# Patient Record
Sex: Male | Born: 1985 | Race: Black or African American | Hispanic: No | Marital: Single | Smoking: Current every day smoker
Health system: Southern US, Community
[De-identification: ages and names within clinical notes are randomized; demographics above are authoritative.]

---
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
## 2005-12-16 ENCOUNTER — Emergency Department (HOSPITAL_COMMUNITY): Admission: EM | Admit: 2005-12-16 | Discharge: 2005-12-16 | Payer: Self-pay | Admitting: Family Medicine

## 2006-01-28 ENCOUNTER — Emergency Department (HOSPITAL_COMMUNITY): Admission: EM | Admit: 2006-01-28 | Discharge: 2006-01-28 | Payer: Self-pay | Admitting: Family Medicine

## 2007-03-03 ENCOUNTER — Emergency Department (HOSPITAL_COMMUNITY): Admission: EM | Admit: 2007-03-03 | Discharge: 2007-03-03 | Payer: Self-pay | Admitting: Family Medicine

## 2008-05-29 ENCOUNTER — Emergency Department (HOSPITAL_COMMUNITY): Admission: EM | Admit: 2008-05-29 | Discharge: 2008-05-29 | Payer: Self-pay | Admitting: Emergency Medicine

## 2008-06-21 ENCOUNTER — Emergency Department (HOSPITAL_COMMUNITY): Admission: EM | Admit: 2008-06-21 | Discharge: 2008-06-21 | Payer: Self-pay | Admitting: Family Medicine

## 2009-01-08 ENCOUNTER — Emergency Department (HOSPITAL_COMMUNITY): Admission: EM | Admit: 2009-01-08 | Discharge: 2009-01-08 | Payer: Self-pay | Admitting: Emergency Medicine

## 2009-07-15 ENCOUNTER — Emergency Department (HOSPITAL_COMMUNITY): Admission: EM | Admit: 2009-07-15 | Discharge: 2009-07-15 | Payer: Self-pay | Admitting: Emergency Medicine

## 2009-10-08 ENCOUNTER — Emergency Department (HOSPITAL_COMMUNITY): Admission: EM | Admit: 2009-10-08 | Discharge: 2009-10-08 | Payer: Self-pay | Admitting: Family Medicine

## 2010-05-16 ENCOUNTER — Emergency Department (HOSPITAL_COMMUNITY): Admission: EM | Admit: 2010-05-16 | Discharge: 2010-05-16 | Payer: Self-pay | Admitting: Family Medicine

## 2010-06-05 ENCOUNTER — Emergency Department (HOSPITAL_COMMUNITY): Admission: EM | Admit: 2010-06-05 | Discharge: 2010-06-05 | Payer: Self-pay | Admitting: Emergency Medicine

## 2010-12-24 ENCOUNTER — Emergency Department (HOSPITAL_COMMUNITY)
Admission: EM | Admit: 2010-12-24 | Discharge: 2010-12-24 | Disposition: A | Discharge status: Home / Self Care | Payer: Self-pay | Attending: Emergency Medicine | Admitting: Emergency Medicine

## 2010-12-24 DIAGNOSIS — A64 Unspecified sexually transmitted disease: Secondary | ICD-10-CM | POA: Insufficient documentation

## 2010-12-24 DIAGNOSIS — R369 Urethral discharge, unspecified: Secondary | ICD-10-CM | POA: Insufficient documentation

## 2010-12-25 LAB — GC/CHLAMYDIA PROBE AMP, GENITAL
Chlamydia, DNA Probe: NEGATIVE
GC Probe Amp, Genital: NEGATIVE

## 2011-01-02 LAB — GC/CHLAMYDIA PROBE AMP, GENITAL
Chlamydia, DNA Probe: POSITIVE — AB
GC Probe Amp, Genital: NEGATIVE

## 2011-01-02 LAB — URINALYSIS, ROUTINE W REFLEX MICROSCOPIC
Bilirubin Urine: NEGATIVE
Ketones, ur: NEGATIVE mg/dL
Nitrite: NEGATIVE
Specific Gravity, Urine: 1.013 (ref 1.005–1.030)
Urobilinogen, UA: 1 mg/dL (ref 0.0–1.0)

## 2011-01-23 LAB — COMPREHENSIVE METABOLIC PANEL
ALT: 15 U/L (ref 0–53)
AST: 19 U/L (ref 0–37)
Albumin: 4 g/dL (ref 3.5–5.2)
Calcium: 9.1 mg/dL (ref 8.4–10.5)
GFR calc Af Amer: >60 mL/min (ref >60)
Sodium: 137 mEq/L (ref 135–145)
Total Protein: 7.2 g/dL (ref 6.0–8.3)

## 2011-01-23 LAB — DIFFERENTIAL
Eosinophils Absolute: 0.1 10*3/uL (ref 0.0–0.7)
Eosinophils Relative: 2 % (ref 0–5)
Lymphs Abs: 3.2 10*3/uL (ref 0.7–4.0)
Monocytes Absolute: 0.6 10*3/uL (ref 0.1–1.0)
Monocytes Relative: 7 % (ref 3–12)
Neutrophils Relative %: 58 % (ref 43–77)

## 2011-01-23 LAB — CBC
MCHC: 35.3 g/dL (ref 30.0–36.0)
RDW: 13.4 % (ref 11.5–15.5)

## 2011-06-03 ENCOUNTER — Emergency Department (HOSPITAL_COMMUNITY)
Admission: EM | Admit: 2011-06-03 | Discharge: 2011-06-03 | Disposition: A | Discharge status: Home / Self Care | Payer: Self-pay | Attending: Emergency Medicine | Admitting: Emergency Medicine

## 2011-06-03 ENCOUNTER — Emergency Department (HOSPITAL_COMMUNITY): Payer: Self-pay

## 2011-06-03 DIAGNOSIS — R55 Syncope and collapse: Secondary | ICD-10-CM | POA: Insufficient documentation

## 2011-06-03 DIAGNOSIS — R0602 Shortness of breath: Secondary | ICD-10-CM | POA: Insufficient documentation

## 2011-06-03 DIAGNOSIS — R079 Chest pain, unspecified: Secondary | ICD-10-CM | POA: Insufficient documentation

## 2011-06-03 DIAGNOSIS — R42 Dizziness and giddiness: Secondary | ICD-10-CM | POA: Insufficient documentation

## 2011-06-07 LAB — GC/CHLAMYDIA PROBE AMP, GENITAL: GC Probe Amp, Genital: NEGATIVE

## 2011-08-07 ENCOUNTER — Emergency Department (HOSPITAL_COMMUNITY)
Admission: EM | Admit: 2011-08-07 | Discharge: 2011-08-07 | Disposition: A | Discharge status: Home / Self Care | Payer: Self-pay | Attending: Emergency Medicine | Admitting: Emergency Medicine

## 2011-08-07 ENCOUNTER — Emergency Department (HOSPITAL_COMMUNITY): Payer: Self-pay

## 2011-08-07 DIAGNOSIS — S51809A Unspecified open wound of unspecified forearm, initial encounter: Secondary | ICD-10-CM | POA: Insufficient documentation

## 2011-08-07 DIAGNOSIS — J45909 Unspecified asthma, uncomplicated: Secondary | ICD-10-CM | POA: Insufficient documentation

## 2011-08-07 DIAGNOSIS — W268XXA Contact with other sharp object(s), not elsewhere classified, initial encounter: Secondary | ICD-10-CM | POA: Insufficient documentation

## 2011-08-07 DIAGNOSIS — S61209A Unspecified open wound of unspecified finger without damage to nail, initial encounter: Secondary | ICD-10-CM | POA: Insufficient documentation

## 2011-08-18 ENCOUNTER — Inpatient Hospital Stay (INDEPENDENT_AMBULATORY_CARE_PROVIDER_SITE_OTHER)
Admission: RE | Admit: 2011-08-18 | Discharge: 2011-08-18 | Disposition: A | Discharge status: Home / Self Care | Payer: Self-pay | Source: Ambulatory Visit | Attending: Family Medicine | Admitting: Family Medicine

## 2011-08-18 DIAGNOSIS — T148XXA Other injury of unspecified body region, initial encounter: Secondary | ICD-10-CM

## 2011-09-12 ENCOUNTER — Encounter: Payer: Self-pay | Admitting: Nurse Practitioner

## 2011-09-12 ENCOUNTER — Emergency Department (HOSPITAL_COMMUNITY)
Admission: EM | Admit: 2011-09-12 | Discharge: 2011-09-12 | Disposition: A | Discharge status: Home / Self Care | Payer: Self-pay | Attending: Emergency Medicine | Admitting: Emergency Medicine

## 2011-09-12 DIAGNOSIS — R109 Unspecified abdominal pain: Secondary | ICD-10-CM | POA: Insufficient documentation

## 2011-09-12 DIAGNOSIS — R300 Dysuria: Secondary | ICD-10-CM | POA: Insufficient documentation

## 2011-09-12 DIAGNOSIS — N509 Disorder of male genital organs, unspecified: Secondary | ICD-10-CM | POA: Insufficient documentation

## 2011-09-12 DIAGNOSIS — R369 Urethral discharge, unspecified: Secondary | ICD-10-CM | POA: Insufficient documentation

## 2011-09-12 LAB — URINALYSIS, ROUTINE W REFLEX MICROSCOPIC
Hgb urine dipstick: NEGATIVE
Specific Gravity, Urine: 1.026 (ref 1.005–1.030)
Urobilinogen, UA: 0.2 mg/dL (ref 0.0–1.0)
pH: 5.5 (ref 5.0–8.0)

## 2011-09-12 MED ORDER — AZITHROMYCIN 250 MG PO TABS
1000.0000 mg | ORAL_TABLET | Freq: Once | ORAL | Status: AC
  Administered 2011-09-12: 1000 mg via ORAL
  Filled 2011-09-12: qty 4

## 2011-09-12 MED ORDER — CEFTRIAXONE SODIUM 250 MG IJ SOLR
250.0000 mg | Freq: Once | INTRAMUSCULAR | Status: AC
  Administered 2011-09-12: 250 mg via INTRAMUSCULAR
  Filled 2011-09-12: qty 250

## 2011-09-12 MED ORDER — LIDOCAINE HCL (PF) 1 % IJ SOLN
INTRAMUSCULAR | Status: AC
  Administered 2011-09-12: 22:00:00
  Filled 2011-09-12: qty 5

## 2011-09-12 NOTE — ED Provider Notes (Signed)
Medical screening examination/treatment/procedure(s) were performed by non-physician practitioner and as supervising physician I was immediately available for consultation/collaboration.    Meeya Goldin R Lakendria Nicastro, MD 09/12/11 2347 

## 2011-09-12 NOTE — ED Provider Notes (Signed)
History     CSN: [id number] Arrival date & time: 09/12/2011  8:29 PM   First MD Initiated Contact with Patient 09/12/11 2131      Chief Complaint  Patient presents with  . Penile Discharge    (Consider location/radiation/quality/duration/timing/severity/associated sxs/prior treatment) HPI History provided by pt.   For the past week, pt has had clear urethral discharge and dysuria.  Has an occasional soreness in left testicle and lower abd.  No fever or rash.  Has had chlamydia in the past and presented similarly.  Girlfriend just admitted to being unfaithful and she has been diagnosed w/ chlamydia.   History reviewed. No pertinent past medical history.  History reviewed. No pertinent past surgical history.  History reviewed. No pertinent family history.  History  Substance Use Topics  . Smoking status: Current Everyday Smoker -- 1.0 packs/day  . Smokeless tobacco: Not on file  . Alcohol Use: 9.0 oz/week    15 Cans of beer per week      Review of Systems  All other systems reviewed and are negative.    Allergies  Penicillins  Home Medications  No current outpatient prescriptions on file.  BP 109/57  Pulse 96  Temp(Src) 98.1 F (36.7 C) (Oral)  Resp 18  Ht 5\' 9"  (1.753 m)  Wt 165 lb (74.844 kg)  BMI 24.37 kg/m2  SpO2 97%  Physical Exam  Nursing note and vitals reviewed. Constitutional: He is oriented to person, place, and time. He appears well-developed and well-nourished. No distress.  HENT:  Head: Normocephalic and atraumatic.  Eyes:       Normal appearance  Neck: Normal range of motion.  Genitourinary: Penis normal. Right testis shows no mass, no swelling and no tenderness. Right testis is descended. Left testis shows no mass, no swelling and no tenderness. Left testis is descended. Circumcised. No penile tenderness. No discharge found.       No rash.  Small amt clear urethral discharge.  Testicles non-tender.   Lymphadenopathy:       Right: No  inguinal adenopathy present.       Left: No inguinal adenopathy present.  Neurological: He is alert and oriented to person, place, and time.  Skin: Skin is warm and dry.  Psychiatric: He has a normal mood and affect. His behavior is normal.    ED Course  Procedures (including critical care time)  Labs Reviewed  URINALYSIS, ROUTINE W REFLEX MICROSCOPIC - Abnormal; Notable for the following:    Ketones, ur 15 (*)    All other components within normal limits  URINE CULTURE  GC/CHLAMYDIA PROBE AMP, GENITAL   No results found.   1. Urethral discharge in male       MDM  Pt presents w/ urethral discharge.  Recent exposure to chlamydia.  No fever, rash, testicular tenderness on exam.  GC/Chlam culture pending.  Pt received zithromax and rocephin.  Referred to STD clinic.  Return precautions discussed.         Arie Sabina Raymond, [place] 09/12/11 2201

## 2011-09-12 NOTE — ED Notes (Signed)
Reports mild abd pain and penis discharge x 4 days, found out girlfriend has chlamydia today

## 2011-09-12 NOTE — ED Notes (Signed)
ASSUMED CARE ON PT. DENIES PAIN , RESPIRATIONS UNLABORED, CALL LIGHT WITHIN REACH . WAITING FOR EDP/PA EVALUATION .

## 2011-09-14 LAB — URINE CULTURE: Culture: NO GROWTH

## 2012-02-10 ENCOUNTER — Emergency Department (HOSPITAL_COMMUNITY)
Admission: EM | Admit: 2012-02-10 | Discharge: 2012-02-10 | Disposition: A | Discharge status: Home / Self Care | Payer: Self-pay | Attending: Emergency Medicine | Admitting: Emergency Medicine

## 2012-02-10 ENCOUNTER — Encounter (HOSPITAL_COMMUNITY): Payer: Self-pay | Admitting: *Deleted

## 2012-02-10 DIAGNOSIS — N509 Disorder of male genital organs, unspecified: Secondary | ICD-10-CM | POA: Insufficient documentation

## 2012-02-10 DIAGNOSIS — Z202 Contact with and (suspected) exposure to infections with a predominantly sexual mode of transmission: Secondary | ICD-10-CM | POA: Insufficient documentation

## 2012-02-10 DIAGNOSIS — R369 Urethral discharge, unspecified: Secondary | ICD-10-CM | POA: Insufficient documentation

## 2012-02-10 MED ORDER — CEFTRIAXONE SODIUM 250 MG IJ SOLR
250.0000 mg | Freq: Once | INTRAMUSCULAR | Status: AC
  Administered 2012-02-10: 250 mg via INTRAMUSCULAR
  Filled 2012-02-10: qty 250

## 2012-02-10 MED ORDER — LIDOCAINE HCL (PF) 1 % IJ SOLN
INTRAMUSCULAR | Status: AC
  Administered 2012-02-10: 1.2 mL
  Filled 2012-02-10: qty 5

## 2012-02-10 MED ORDER — AZITHROMYCIN 250 MG PO TABS
1000.0000 mg | ORAL_TABLET | Freq: Once | ORAL | Status: AC
  Administered 2012-02-10: 1000 mg via ORAL
  Filled 2012-02-10: qty 4

## 2012-02-10 NOTE — ED Provider Notes (Signed)
History     CSN: [id number]  Arrival date & time 02/10/12  1443   First MD Initiated Contact with Patient 02/10/12 1523      Chief Complaint  Patient presents with  . Abdominal Pain    (Consider location/radiation/quality/duration/timing/severity/associated sxs/prior treatment) HPI  Patient presents to ER complaining of a three day hx of gradual onset penile d/c, mild testicular "soreness" stating that his girlfriend just recently told him she was positive for chlamydia. He denies aggravating or alleviating factors. Denies fevers, chills, abdominal pain, n/v/d, testicular swelling, genital lesions. States he has no known medical problems and takes no meds on a regular basis. Symptoms were gradual onsets, intermittent and persistent. States remote hx of GC/chlamydia infection.   History reviewed. No pertinent past medical history.  History reviewed. No pertinent past surgical history.  No family history on file.  History  Substance Use Topics  . Smoking status: Current Everyday Smoker -- 1.0 packs/day  . Smokeless tobacco: Not on file  . Alcohol Use: 9.0 oz/week    15 Cans of beer per week      Review of Systems  Constitutional: Negative for fever and chills.  Eyes: Negative for discharge and itching.  Respiratory: Negative for shortness of breath.   Cardiovascular: Negative for chest pain.  Gastrointestinal: Negative for nausea, vomiting, abdominal pain, diarrhea, blood in stool, abdominal distention and rectal pain.  Genitourinary: Positive for discharge and testicular pain. Negative for dysuria, flank pain, penile swelling, genital sores and penile pain.  Musculoskeletal: Negative for myalgias and joint swelling.  Skin: Negative for rash.  Neurological: Negative for dizziness.  Hematological: Negative for adenopathy.    Allergies  Penicillins  Home Medications  No current outpatient prescriptions on file.  BP 123/80  Pulse 102  Temp(Src) 98.4 F (36.9 C)  (Oral)  Resp 20  SpO2 97%  Physical Exam  Nursing note and vitals reviewed. Constitutional: He is oriented to person, place, and time. He appears well-developed and well-nourished.  HENT:  Head: Normocephalic and atraumatic.  Neck: Normal range of motion. Neck supple.  Cardiovascular: Normal rate.   Pulmonary/Chest: Effort normal.  Abdominal: Soft. Bowel sounds are normal. He exhibits no distension and no mass. There is no tenderness. There is no rebound and no guarding.  Genitourinary: Penis normal. No penile tenderness.       No penile discharge. No testicular tenderness to palpation. Testes descended bilaterally.  Musculoskeletal: Normal range of motion.  Lymphadenopathy:    He has no cervical adenopathy.       Right: No inguinal adenopathy present.       Left: No inguinal adenopathy present.  Neurological: He is alert and oriented to person, place, and time.  Skin: Skin is warm and dry. No rash noted.  Psychiatric: He has a normal mood and affect.    ED Course  Procedures (including critical care time)  PO zithromax and IM rocephin.   Labs Reviewed - No data to display No results found.   1. Exposure to STD       MDM  Young healthy male with subjective hx of penile discharge and report of chlamydia exposure but no testicular tenderness to palpation, abdominal pain or penile lesions. Discussed at length with patient the need for followup with [facility] STD clinic for HIV screening. Patient prophylactically treated for gonorrhea and chlamydia.         Jenness Corner, [place] 02/10/12 1541

## 2012-02-10 NOTE — ED Notes (Signed)
Patient reports his girlfriend just informed him that she has clamydia.  Patient has had penile discharge and he has noticed his testicles are sore at times.  He has noticed sx for 3 days

## 2012-02-10 NOTE — ED Provider Notes (Signed)
Medical screening examination/treatment/procedure(s) were performed by non-physician practitioner and as supervising physician I was immediately available for consultation/collaboration.  Cheri Guppy, MD 02/10/12 [phone]

## 2012-02-10 NOTE — ED Notes (Signed)
Pt reports penile d/c, lower abd pain, and bilateral testicle pain x3 days. Pt reports his girlfriend informed him she tested positive for gonorrhea.

## 2012-02-11 LAB — GC/CHLAMYDIA PROBE AMP, GENITAL
Chlamydia, DNA Probe: NEGATIVE
GC Probe Amp, Genital: NEGATIVE

## 2013-01-18 ENCOUNTER — Encounter (HOSPITAL_COMMUNITY): Payer: Self-pay | Admitting: *Deleted

## 2013-01-18 ENCOUNTER — Emergency Department (HOSPITAL_COMMUNITY)
Admission: EM | Admit: 2013-01-18 | Discharge: 2013-01-18 | Disposition: A | Discharge status: Home / Self Care | Payer: Self-pay | Attending: Emergency Medicine | Admitting: Emergency Medicine

## 2013-01-18 DIAGNOSIS — R369 Urethral discharge, unspecified: Secondary | ICD-10-CM | POA: Insufficient documentation

## 2013-01-18 DIAGNOSIS — K029 Dental caries, unspecified: Secondary | ICD-10-CM | POA: Insufficient documentation

## 2013-01-18 DIAGNOSIS — Z202 Contact with and (suspected) exposure to infections with a predominantly sexual mode of transmission: Secondary | ICD-10-CM | POA: Insufficient documentation

## 2013-01-18 DIAGNOSIS — R599 Enlarged lymph nodes, unspecified: Secondary | ICD-10-CM | POA: Insufficient documentation

## 2013-01-18 DIAGNOSIS — Z7251 High risk heterosexual behavior: Secondary | ICD-10-CM | POA: Insufficient documentation

## 2013-01-18 DIAGNOSIS — F17200 Nicotine dependence, unspecified, uncomplicated: Secondary | ICD-10-CM | POA: Insufficient documentation

## 2013-01-18 MED ORDER — CEFTRIAXONE SODIUM 250 MG IJ SOLR
250.0000 mg | Freq: Once | INTRAMUSCULAR | Status: AC
  Administered 2013-01-18: 250 mg via INTRAMUSCULAR
  Filled 2013-01-18: qty 250

## 2013-01-18 MED ORDER — AZITHROMYCIN 250 MG PO TABS
1000.0000 mg | ORAL_TABLET | Freq: Once | ORAL | Status: AC
  Administered 2013-01-18: 1000 mg via ORAL
  Filled 2013-01-18: qty 4

## 2013-01-18 NOTE — ED Provider Notes (Signed)
History     CSN: [id number]  Arrival date & time 01/18/13  1246   First MD Initiated Contact with Patient 01/18/13 1322      Chief Complaint  Patient presents with  . Exposure to STD    (Consider location/radiation/quality/duration/timing/severity/associated sxs/prior treatment) Patient is a 27 y.o. male presenting with STD exposure.  Exposure to STD   Pt has several complaints, chief among them is exposure to STD. He reports having unprotected intercourse about 2 weeks ago. States he has noticed penile discharge (clear, only when urinating) associated with mild testicular soreness and lower abdominal soreness. He was seen for similar symptoms about a year ago, had neg GC/C then.   He has a secondary complaint of 'knots' around his R ear. He has some tenderness, concerned about lymph nodes  Also reports intermittent pain in R upper and lower molars. Asking for referral to dentist.   History reviewed. No pertinent past medical history.  History reviewed. No pertinent past surgical history.  History reviewed. No pertinent family history.  History  Substance Use Topics  . Smoking status: Current Every Day Smoker -- 1.00 packs/day  . Smokeless tobacco: Not on file  . Alcohol Use: 9.0 oz/week    15 Cans of beer per week      Review of Systems All other systems reviewed and are negative except as noted in HPI.    Allergies  Penicillins  Home Medications  No current outpatient prescriptions on file.  BP 125/70  Pulse 111  Temp(Src) 98.4 F (36.9 C) (Oral)  Resp 16  SpO2 96%  Physical Exam  Nursing note and vitals reviewed. Constitutional: He is oriented to person, place, and time. He appears well-developed and well-nourished.  HENT:  Head: Normocephalic and atraumatic.  Poor dentition; several small non-tender lymph nodes in periauricular area  Eyes: EOM are normal. Pupils are equal, round, and reactive to light.  Neck: Normal range of motion. Neck supple.   Cardiovascular: Normal rate, normal heart sounds and intact distal pulses.   Pulmonary/Chest: Effort normal and breath sounds normal.  Abdominal: Bowel sounds are normal. He exhibits no distension. There is no tenderness. There is no rebound and no guarding.  Genitourinary:  No discharge, no testicular tenderness or swelling  Musculoskeletal: Normal range of motion. He exhibits no edema and no tenderness.  Lymphadenopathy:    He has cervical adenopathy.  Neurological: He is alert and oriented to person, place, and time. He has normal strength. No cranial nerve deficit or sensory deficit.  Skin: Skin is warm and dry. No rash noted.  Psychiatric: He has a normal mood and affect.    ED Course  Procedures (including critical care time)  Labs Reviewed  GC/CHLAMYDIA PROBE AMP   No results found.   1. Exposure to STD   2. Dental caries       MDM  Empiric treatment for GC/C, swab sent. Advised [facility] follow up. Given dentist referral.        Bonnita Levan. Bernette Mayers, MD 01/18/13 1342

## 2013-01-18 NOTE — ED Notes (Signed)
Pt reports having penile discharge, abd pain, scrotum pain, pt thinks he has std. Also wants small knots to be checked around his right ear. No distress noted at triage.

## 2013-01-18 NOTE — ED Notes (Signed)
Pt c/o penile d/c along with abd pain and painful urination that started a week ago. Pt reports he did have unprotected sex. Pt also c/o right sided tooth pain, sts he has a wisdom tooth that is broken off and causing him a great deal of pain. Pt has swelling to right side of neck and small bump behind right side of ear. Pt in nad, speaking in full sentences, skin warm and dry, resp e/u.

## 2013-01-19 ENCOUNTER — Encounter (HOSPITAL_COMMUNITY): Payer: Self-pay | Admitting: *Deleted

## 2013-01-19 ENCOUNTER — Emergency Department (INDEPENDENT_AMBULATORY_CARE_PROVIDER_SITE_OTHER)
Admission: EM | Admit: 2013-01-19 | Discharge: 2013-01-19 | Disposition: A | Discharge status: Home / Self Care | Payer: Self-pay | Source: Home / Self Care | Attending: Family Medicine | Admitting: Family Medicine

## 2013-01-19 DIAGNOSIS — B0239 Other herpes zoster eye disease: Secondary | ICD-10-CM

## 2013-01-19 LAB — GC/CHLAMYDIA PROBE AMP: GC Probe RNA: NEGATIVE

## 2013-01-19 MED ORDER — ACYCLOVIR 200 MG PO CAPS: 800.0000 mg | ORAL_CAPSULE | Freq: Every day | ORAL | Status: DC

## 2013-01-19 MED ORDER — HYDROCODONE-ACETAMINOPHEN 5-325 MG PO TABS: 2.0000 | ORAL_TABLET | Freq: Four times a day (QID) | ORAL | Status: DC | PRN

## 2013-01-19 MED ORDER — PREDNISONE 20 MG PO TABS: ORAL_TABLET | ORAL | Status: DC

## 2013-01-19 MED ORDER — SULFAMETHOXAZOLE-TRIMETHOPRIM 800-160 MG PO TABS: 1.0000 | ORAL_TABLET | Freq: Two times a day (BID) | ORAL | Status: AC

## 2013-01-19 NOTE — ED Provider Notes (Signed)
History     CSN: [id number]  Arrival date & time 01/19/13  1036   First MD Initiated Contact with Patient 01/19/13 1043      Chief Complaint  Patient presents with  . Rash    (Consider location/radiation/quality/duration/timing/severity/associated sxs/prior treatment) HPI Comments: 27 year old male with recent ED visits for STD exposure yesterday. Comes today complaining of a rash involving the upper and lower lid of his right eye. He has had tender lymph nodes anterior to his right ear and in the right side of his neck that was noted on ED doctor notes yesterday. He also has had burning sensation in the right upper side of his face and scalp but the rash is more noticeable since yesterday evening and today,  now associated with mild swelling. Denies any pain inside his eye. No pain with ocular movements. No photophobia. No eye tearing. No eye drainage. Patient had chickenpox as a child. No history of shingles. Patient was treated empirically for gonorrhea and chlamydia yesterday with Rocephin and azithromycin.    History reviewed. No pertinent past medical history.  History reviewed. No pertinent past surgical history.  History reviewed. No pertinent family history.  History  Substance Use Topics  . Smoking status: Current Every Day Smoker -- 1.00 packs/day  . Smokeless tobacco: Not on file  . Alcohol Use: 9.0 oz/week    15 Cans of beer per week      Review of Systems  Constitutional: Negative for fever, chills and fatigue.  HENT: Negative for congestion and sore throat.   Eyes: Negative for photophobia, pain, discharge, redness, itching and visual disturbance.       Left upper and lower eye lid rash as per HPI  Skin: Positive for rash.  Neurological: Negative for dizziness, seizures, facial asymmetry, numbness and headaches.  Hematological: Positive for adenopathy.    Allergies  Penicillins  Home Medications   Current Outpatient Rx  Name  Route  Sig  Dispense   Refill  . acetaminophen (TYLENOL) 500 MG tablet   Oral   Take 1,000 mg by mouth every 6 (six) hours as needed for pain.         Marland Kitchen acyclovir (ZOVIRAX) 200 MG capsule   Oral   Take 4 capsules (800 mg total) by mouth 5 (five) times daily.   200 capsule   0   . HYDROcodone-acetaminophen (NORCO/VICODIN) 5-325 MG per tablet   Oral   Take 2 tablets by mouth every 6 (six) hours as needed for pain.   20 tablet   0   . predniSONE (DELTASONE) 20 MG tablet      2 tabs po daily for 7 days   14 tablet   0   . sulfamethoxazole-trimethoprim (BACTRIM DS,SEPTRA DS) 800-160 MG per tablet   Oral   Take 1 tablet by mouth 2 (two) times daily.   14 tablet   0     BP 138/75  Pulse 86  Temp(Src) 97.9 F (36.6 C) (Oral)  Resp 16  SpO2 100%  Physical Exam  Nursing note and vitals reviewed. Constitutional: He is oriented to person, place, and time. He appears well-developed and well-nourished. No distress.  HENT:  Head: Normocephalic and atraumatic.  Mouth/Throat: Oropharynx is clear and moist. No oropharyngeal exudate.  Eyes: Conjunctivae and EOM are normal. Pupils are equal, round, and reactive to light. Right eye exhibits no discharge. Left eye exhibits no discharge.  No corneal ulcers or abrasions on fluorescein stain. There is a mild vesicular confluent rash  involving left upper eye lid and area below left lower eye lid closer to the nose bridge. Minimal base erythema. No induration or fluctuations. No drainage. Few vesicles with dry crusting on top.  There are associated reactive tender preauricular lymphadenopathies.  Patient exhibits hyperesthesia in right upper face quadrant and temporo parietal scalp area despite no evidence of rash in the last mentioned area.   Cardiovascular: Normal rate, regular rhythm and normal heart sounds.   Pulmonary/Chest: Effort normal and breath sounds normal.  Lymphadenopathy:    He has no cervical adenopathy.  Neurological: He is alert and oriented  to person, place, and time.  Skin: Rash noted. He is not diaphoretic.  As in eye exam    ED Course  Procedures (including critical care time)  Labs Reviewed - No data to display No results found.   1. Zoster blepharitis       MDM  Impress zoster. Prescribed acyclovir, prednisone and Norco. Also prescribed Septra for possible over infection. Asked to followup with the eye specialist and referral was provided. Supportive care and red flags that should prompt his return to medical attention discussed with patient and provided in writing.        Sharin Grave, MD 01/20/13 [id number]

## 2013-01-19 NOTE — ED Notes (Signed)
Pt  Has  Several   Raised  Red   Bumps   On  Face  Redness  Swelling is  Present  And  The  Pay  Has  Swelling  In gland  Areas  Around  Ear  -  The  Eye  Appears  Slight  Irritated  As  Well  On  The  r   Side  Of  Face       Symptoms  X  About  6  Days

## 2013-04-26 IMAGING — CR DG CHEST 2V
2 series · 2 of 2 positions shown · non-contrast
Comparison: Chest radiograph 07/15/2009

CLINICAL DATA: Shortness of breath.  Mid chest pain and dizziness.

CHEST - 2 VIEW

[w chest pa]
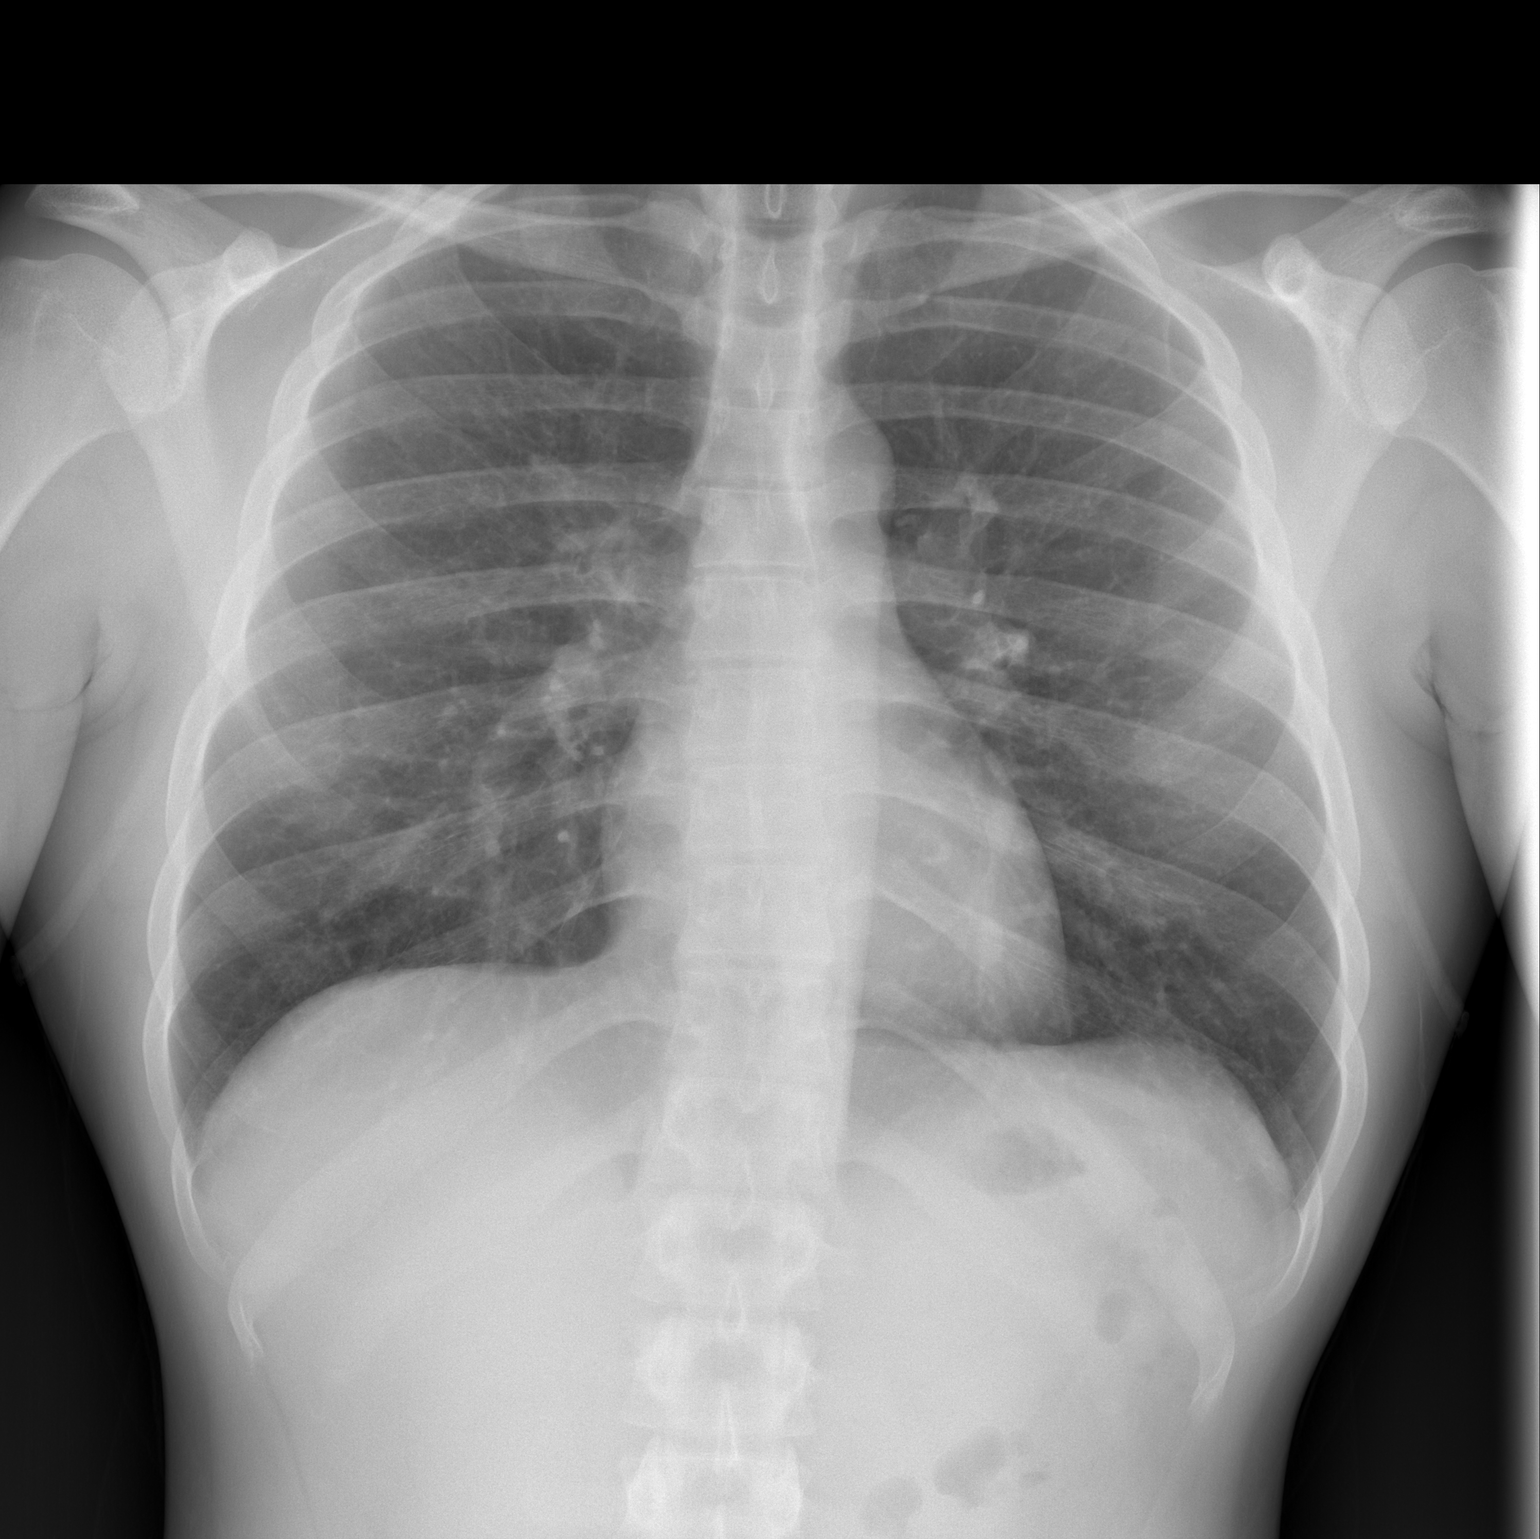

[w chest lat]
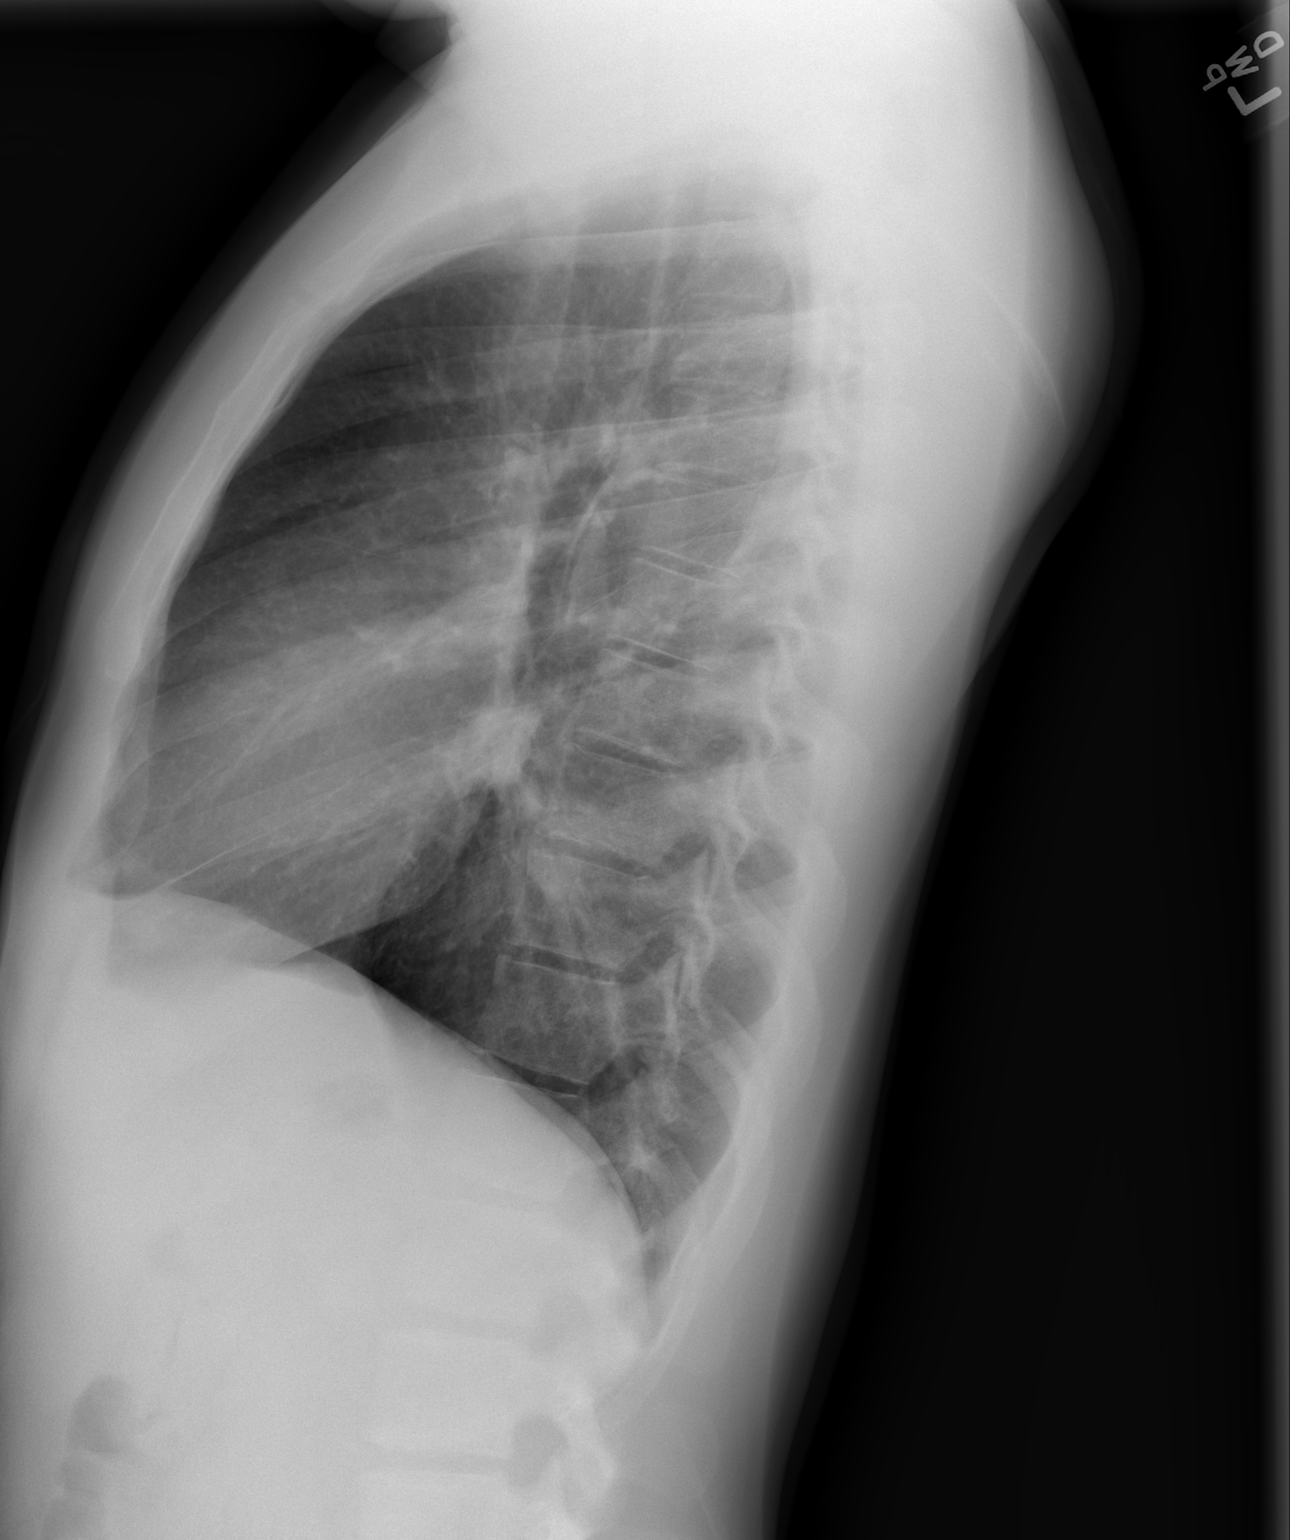

[2 of 2 positions shown; findings below may reference images not displayed]

FINDINGS: The heart, mediastinal, and hilar contours are normal.
The trachea is midline.  The lungs are normally expanded and clear.
There is no pleural effusion or pneumothorax.  Visualized bony
thorax is intact.
IMPRESSION: No acute cardiopulmonary disease.

## 2013-05-18 ENCOUNTER — Emergency Department (HOSPITAL_COMMUNITY)
Admission: EM | Admit: 2013-05-18 | Discharge: 2013-05-18 | Disposition: A | Discharge status: Home / Self Care | Payer: Self-pay | Attending: Emergency Medicine | Admitting: Emergency Medicine

## 2013-05-18 ENCOUNTER — Encounter (HOSPITAL_COMMUNITY): Payer: Self-pay | Admitting: Cardiology

## 2013-05-18 DIAGNOSIS — Z880 Allergy status to penicillin: Secondary | ICD-10-CM | POA: Insufficient documentation

## 2013-05-18 DIAGNOSIS — Y929 Unspecified place or not applicable: Secondary | ICD-10-CM | POA: Insufficient documentation

## 2013-05-18 DIAGNOSIS — S61209A Unspecified open wound of unspecified finger without damage to nail, initial encounter: Secondary | ICD-10-CM | POA: Insufficient documentation

## 2013-05-18 DIAGNOSIS — W260XXA Contact with knife, initial encounter: Secondary | ICD-10-CM | POA: Insufficient documentation

## 2013-05-18 DIAGNOSIS — S61217A Laceration without foreign body of left little finger without damage to nail, initial encounter: Secondary | ICD-10-CM

## 2013-05-18 DIAGNOSIS — Y9389 Activity, other specified: Secondary | ICD-10-CM | POA: Insufficient documentation

## 2013-05-18 DIAGNOSIS — W261XXA Contact with sword or dagger, initial encounter: Secondary | ICD-10-CM | POA: Insufficient documentation

## 2013-05-18 DIAGNOSIS — F17200 Nicotine dependence, unspecified, uncomplicated: Secondary | ICD-10-CM | POA: Insufficient documentation

## 2013-05-18 NOTE — ED Notes (Signed)
Bleeding is controlled

## 2013-05-18 NOTE — ED Provider Notes (Signed)
Medical screening examination/treatment/procedure(s) were conducted as a shared visit with non-physician practitioner(s) and myself.  I personally evaluated the patient during the encounter  Pt with cut to proximal left non dominant hand, 5th phalanx while cutting steak, appears to have injured his PDF.  Will consult with hand surgery.    Gavin Pound. Oletta Lamas, MD 05/18/13 7829

## 2013-05-18 NOTE — ED Notes (Signed)
Pt not located in room.

## 2013-05-18 NOTE — ED Notes (Signed)
Pt returned to room  

## 2013-05-18 NOTE — Discharge Instructions (Signed)
Splint Care Splints protect and rest injuries. Splints can be made of plaster, fiberglass, or metal. They are used to treat broken bones, sprains, tendonitis, and other injuries. HOME CARE  Keep the injured area raised (elevated) while sitting or lying down. Keep the injured body part just above the level of the heart. This will decrease puffiness (swelling) and pain.  If an elastic bandage was used to hold the splint, it can be loosened. Only loosen it to make room for puffiness and to ease pain.  Keep the splint clean and dry.  Do not scratch the skin under the splint with sharp or pointed objects.  Follow up with your doctor as told. GET HELP RIGHT AWAY IF:   There is more pain or pressure around the injury.  There is numbness, tingling, or pain in the toes or fingers past the injury.  The fingers or toes become cold or blue.  The splint becomes too soft or breaks before the injury is healed. MAKE SURE YOU:   Understand these instructions.  Will watch this condition.  Will get help right away if you are not doing well or get worse. Document Released: 07/14/2008 Document Revised: 12/28/2011 Document Reviewed: 07/14/2008 [facility] Patient Information 2014 [place], [place].  RESOURCE GUIDE  Chronic Pain Problems: Contact [facility]  [phone] Patients need to be referred by their primary care doctor.  Insufficient Money for Medicine: Contact United Way:  call [phone]  No Primary Care Doctor: - Call Health Connect  [phone] - can help you locate a primary care doctor that  accepts your insurance, provides certain services, etc. - Physician Referral Service- [phone]  Agencies that provide inexpensive medical care: - [facility]  [phone] - [facility]  [phone] - [facility]  [phone] - Women's Clinic  [phone] - Planned Parenthood  [phone] [facility]   [phone]  Medicaid-accepting [facility] Providers: - [facility]- [address]  [phone], Mon-Fri 9am-7pm, Sat 9am-1pm - [facility]- [address] [place], Suite [place]  [phone] - [facility]- [address], Suite [place]  [phone] [facility]- [address]  [phone] - Renaye Rakers- [address] [place], [address], [phone]  Only accepts [place] patients after they have their name  applied to their card  Self Pay (no insurance) in [place]: - Sickle Cell Patients - [facility]  [address] [place], [phone] - [facility]- [address] [place]  [phone]       [facility] [place]- [address]       -     [facility]- see information above (Speak to [employer] if you do not have insurance)       -  [facility]- [address],  [phone]       -  [facility]- [address], [phone]       -  Dr Julio Sicks-  [address], [place], [phone]       -  [facility] - [address] [phone]       -  [facility]- [address], [phone], also [address] [phone]       -  [facility]- [address] [place], [phone], 1st & 3rd Saturday         every month, 10am-1pm  -     [facility] [employer]   [address], [place].   Phone:  [phone], Fax:  [phone]. Hours of Operation:  9 am - 6 pm, M-F.  -     [facility] for Children   [address], [place]   Phone: [phone], Fax: [phone]. Hours of Operation:  8:30 am - 5:30 pm, M-F.  [facility] [address] [place], [place] [address] [phone]  [facility] [address] [place], [place] [address] [phone]  1) Find a Doctor and Pay Out of  Pocket Although you won't have to find out who is covered by your insurance plan, it is a good idea to ask around and get recommendations. You will then need to call the office and see if the doctor you have chosen will accept you as a new patient and what types of options they offer for patients who are self-pay. Some doctors offer discounts or will set up payment plans for their patients who do not have insurance, but you will need to ask so you aren't surprised when you get to your appointment.  2) Contact Your Local Health Department Not all health departments have doctors that can see patients for sick visits, but many do, so it is worth a call to see if yours does. If you don't know where your local health department is, you can check in your phone book. The CDC also has a tool to help you locate your state's health department, and many state websites also have listings of all of their local health departments.  3) Find a Walk-in Clinic If your illness is not likely to be very severe or complicated, you may want to try a walk in clinic. These are popping up all over the country in pharmacies, drugstores, and shopping centers. They're usually staffed by nurse practitioners or physician assistants that have been trained to treat common illnesses and complaints. They're usually fairly quick and inexpensive. However, if you have serious medical issues or chronic medical problems, these are probably not your best option  STD Testing - [facility] Department of [facility] [place], STD Clinic, [address] [place], phone [phone] or [phone].  Monday - Friday, call for an appointment. [facility] Department of [employer], STD Clinic, [place] [address], [place], phone [phone] or [phone].  Monday - Friday, call for an appointment.  Abuse/Neglect: [facility] Child Abuse Hotline 308-808-1175 [facility] Child Abuse Hotline 661-577-0853  (After Hours)  Emergency Shelter:  [facility] 5203352524  Maternity Homes: - Room at the [place] [facility] [phone] - [facility] 5033503422  MRSA Hotline #:   [phone]  Dental Assistance If unable to pay or uninsured, contact:  [facility]. to become qualified for the adult dental clinic.  Patients with Medicaid: [facility] [phone] [address], [phone] [address], [phone]  If unable to pay, or uninsured, contact [facility] [phone] in [place], [phone] in [facility]) to become qualified for the adult dental clinic  [facility] [address] [place], [place] [address] [phone] [email or web address]  Other Proofreader Services: Chartered loss adjuster [facility]- [address], [place]  [place], [place], [address], [phone], Ext. [phone], 2nd and 4th Thursday of the month at 6:30am.  10 clients each day by appointment, can sometimes see walk-in patients if someone does not show for an appointment. [facility]- [address] [place], [place], [address], [phone] - [facility] [address] [place], [place], [address], [phone] - [facility]- [phone] [facility]- [phone] [facility] Health [phone]       Behavioral Health Resources in the [facility]  Intensive Outpatient Programs: [facility]      [address] [place], [place] [phone] Both a day and evening program       [facility] Outpatient     [address]        [place], [place] [address] [phone]         ADS: [facility] [address] [place] [phone]  [facility] Mental Health ACCESS LINE: [phone] or [phone] [address] [place], [place]  [address] [email or web address]   Substance Abuse Resources: - [facility] Drug Services  [phone] - [facility] [phone] - [facility] [place] [phone] Floydene Flock [phone] - Residential & Outpatient Substance Abuse Program  [phone]  Psychological Services: [facility]  [phone] [facility]  [phone] - [facility], [phone] [place]. [address], [place], ACCESS LINE: [phone] or [phone], [email or web address]  Mobile Crisis Teams:                                        Therapeutic Alternatives         Mobile Crisis Care Unit [phone]             [facility] [address] Ginette Otto [phone]                                         Interventionist [address] [place] [phone]  Self-Help/Support Groups: Mental Health Assoc. of [employer] of support groups [phone] (call for more info)  Narcotics Anonymous (NA) Caring Services [address] [place] - 2 meetings at this location  Residential Treatment Programs:  [facility]      [address]        [place]       [phone]         [facility] [address] [place] [address] [place], [place]  [address] [phone]  [facility]  [address] [place], [place] [address] [phone] Admissions: 8am-3pm M-F  [facility]     [address]        [place], [place] [address]       [phone]         [facility] [address] [place], [place] [phone]  [facility] [address] [place], [place] [phone]  [facility] - Intensive Outpatient      [address]     [place], [place]       [phone]         [facility] ([facility] Recovery [facility]     [address] [place],  [place] [phone] or [phone]  Residential Treatment Services (RTS), Medicaid [address] [place], [place] [phone]  [facility]ellowship [address]                                               [address] [place] [phone]  [facility] Resources: [phone]               General Therapy                                                Angie Fava, PhD        [address] [place], [place] [address]         [phone]   Insurance  [facility] Behavioral   [address] [place], [place] [address] [phone]  [facility] [address] [place], [place] [address] [phone] Insurance/Medicaid/sponsorship through [facility]                                              [address]                                        [place], [place] [address]    Therapy/tele-psych/case         [phone]          [facility] [address], [place]  [address]  Adolescent/group home/case management [phone]                                           Creola Corn PhD       General therapy       Insurance   [phone]         Dr. Lolly Mustache, [place], M-F [phone]  [facility] [place]  [employer] [facility] Dept. [address]                 [address]         [address] [place] [address]  [place]                                               Cristobal Goldmann Phone:  [phone]  Phone:  [phone]                   Phone:  [phone]  [facility], [phone] - [facility] - [phone]       -     [facility] in [place], [address],             [phone], Insurance  [place] Child Abuse Hotline 573-625-7821 or [phone] (After Hours)

## 2013-05-18 NOTE — Progress Notes (Signed)
Orthopedic Tech Progress Note Patient Details:  Dwayne Miller Jan 13, 1986 [id number]  Ortho Devices Type of Ortho Device: Finger splint Ortho Device/Splint Location: RUE Ortho Device/Splint Interventions: Ordered;Application   Jennye Moccasin 05/18/2013, 7:37 PM

## 2013-05-18 NOTE — ED Notes (Signed)
Pt at nurses station asking how much more time he is going to have to wait to get his hand stitched up.  PA informed pt that she is waiting for a phone call from hand surgeon in order to suture his hand.  Pt confrontational with PA and RN stating "I have to take the bus and the last bus leaves at 10.  I can't be here past then."  PA informed pt she would get him out by then.

## 2013-05-18 NOTE — ED Notes (Signed)
Pt reports that he was cutting steak and cut his right pinky finger. Bleeding controlled.

## 2013-05-18 NOTE — ED Provider Notes (Signed)
CSN: [id number]     Arrival date & time 05/18/13  1648 History  This chart was scribed for non-physician practitioner Dierdre Forth, PA-C, working with Gavin Pound. Oletta Lamas, MD, by Yevette Edwards, ED Scribe. This patient was seen in room TR06C/TR06C and the patient's care was started at 5:38 PM.   First MD Initiated Contact with Patient 05/18/13 1714     Chief Complaint  Patient presents with  . Extremity Laceration    Patient is a 27 y.o. male presenting with skin laceration. The history is provided by the patient. No language interpreter was used.  Laceration Location:  Finger Finger laceration location:  L little finger Laceration mechanism:  Knife Foreign body present:  No foreign bodies Relieved by:  Pressure Worsened by:  Movement  HPI Comments: Dwayne Miller is a 27 y.o. male who presents to the Emergency Department complaining of a laceration to his left pinky finger which occurred as he was attempting to cut a steak with a steak-knife. He states that bending his finger causes pain, and he has difficulty moving his finger certain ways. At bedside, the bleeding is controlled with applied pressure. The pt reports that he has a h/o sutures to other areas. The pt is right-handed.   History reviewed. No pertinent past medical history. History reviewed. No pertinent past surgical history. History reviewed. No pertinent family history. History  Substance Use Topics  . Smoking status: Current Every Day Smoker -- 1.00 packs/day    Types: Cigarettes  . Smokeless tobacco: Not on file  . Alcohol Use: 9.0 oz/week    15 Cans of beer per week    Review of Systems  Musculoskeletal: Positive for arthralgias.  Skin: Positive for wound.  All other systems reviewed and are negative.    Allergies  Penicillins  Home Medications   Current Outpatient Rx  Name  Route  Sig  Dispense  Refill  . acetaminophen (TYLENOL) 500 MG tablet   Oral   Take 1,000 mg by mouth every 6 (six)  hours as needed for pain.          Triage Vitals: BP 118/76  Pulse 113  Temp(Src) 98.1 F (36.7 C) (Oral)  Resp 18  SpO2 98%  Physical Exam  Nursing note and vitals reviewed. Constitutional: He is oriented to person, place, and time. He appears well-developed and well-nourished. No distress.  HENT:  Head: Normocephalic and atraumatic.  Eyes: Conjunctivae are normal. No scleral icterus.  Neck: Normal range of motion.  Cardiovascular: Normal rate, regular rhythm, normal heart sounds and intact distal pulses.   No murmur heard. Pulmonary/Chest: Effort normal and breath sounds normal. No respiratory distress.  Musculoskeletal: He exhibits tenderness. He exhibits no edema.  Full range of motion of fingers 1 through 4 of the left hand.  Full extension of the left little finger with resistance. Patient unable to flex tip of left little finger, but able to flex base of finger against resistance  Neurological: He is alert and oriented to person, place, and time.  Sensation intact Decrease strength with flexion of the Left little finger  Skin: Skin is warm and dry. He is not diaphoretic.  1.5cm laceration to the base of the Left little finger without visible tendon    ED Course   DIAGNOSTIC STUDIES: Oxygen Saturation is 98% on room air, normal  by my interpretation.    COORDINATION OF CARE:  5:42 PM-Discussed treatment plan with patient which includes a digital block, and the patient agreed to the  plan.   5:48 PM- Consulted with Dr. Oletta Lamas. Dr. Oletta Lamas visited the pt, and he observed the pt's finger. He determined that the pt's profundus tendon was affected by the knife.   7:00 PM- Consulted with Dr. Melvyn Novas, the hand surgeon. He advised suturing in the ED, and then a follow-up in his office tomorrow.    Procedures (including critical care time)  7:06 PM- LACERATION REPAIR PROCEDURE NOTE The patient's identification was confirmed and consent was obtained. This procedure was  performed by Dierdre Forth, PA-C, at 7:06 PM. Site: proximal portion of the left pinky finger Sterile procedures observed Anesthetic used (type and amt): 2% without epi Suture type/size: 5.0 Prolene Length:1.5  # of Sutures: 1 Technique: Simple interrupted  Complexity: Simple Tetanus UTD  Site anesthetized, irrigated with NS, explored without evidence of foreign body, wound well approximated, site covered with dry, sterile dressing.  Patient tolerated procedure well without complications. Instructions for care discussed verbally and patient provided with additional written instructions for homecare and f/u.  Labs Reviewed - No data to display No results found. 1. Laceration of left little finger w/o foreign body w/o damage to nail, initial encounter     MDM  Dwayne Miller presents with laceration to the Left little finger.  Patient unable to flex tip of left little finger, injury consistent with laceration to the profundus.  Discussed with Dr. Orlan Leavens of hand surgery who recommended switching with one stitch and have the patient followup tomorrow for further evaluation and tendon repair.  Tdap UTD.  Pressure irrigation performed. Laceration occurred < 8 hours prior to repair which was well tolerated. Pt has no co morbidities to effect normal wound healing. Discussed suture home care w pt and answered questions. Pt to f-u tomorrow for tendon repair. Pt is hemodynamically stable w no complaints prior to dc.  I have also discussed reasons to return immediately to the ER.  Patient expresses understanding and agrees with plan.     Dahlia Client Kaleiyah Polsky, PA-C 05/24/13 1947

## 2013-05-18 NOTE — ED Notes (Signed)
Pt has rinsed right little finger with warm soapy water. Pt now has right little finger soaking in warm soapy water

## 2016-01-30 ENCOUNTER — Encounter (HOSPITAL_COMMUNITY): Payer: Self-pay

## 2016-01-30 ENCOUNTER — Emergency Department (HOSPITAL_COMMUNITY)
Admission: EM | Admit: 2016-01-30 | Discharge: 2016-01-31 | Disposition: A | Discharge status: Home / Self Care | Payer: Self-pay | Attending: Emergency Medicine | Admitting: Emergency Medicine

## 2016-01-30 DIAGNOSIS — Z880 Allergy status to penicillin: Secondary | ICD-10-CM | POA: Insufficient documentation

## 2016-01-30 DIAGNOSIS — K921 Melena: Secondary | ICD-10-CM | POA: Insufficient documentation

## 2016-01-30 DIAGNOSIS — R1084 Generalized abdominal pain: Secondary | ICD-10-CM

## 2016-01-30 DIAGNOSIS — F17210 Nicotine dependence, cigarettes, uncomplicated: Secondary | ICD-10-CM | POA: Insufficient documentation

## 2016-01-30 LAB — COMPREHENSIVE METABOLIC PANEL
ALBUMIN: 4.3 g/dL (ref 3.5–5.0)
ALK PHOS: 81 U/L (ref 38–126)
ALT: 19 U/L (ref 17–63)
ANION GAP: 14 (ref 5–15)
AST: 25 U/L (ref 15–41)
BUN: <5 mg/dL — ABNORMAL LOW (ref 6–20)
CALCIUM: 9 mg/dL (ref 8.9–10.3)
CO2: 21 mmol/L — AB (ref 22–32)
Chloride: 102 mmol/L (ref 101–111)
Creatinine, Ser: 0.94 mg/dL (ref 0.61–1.24)
GFR calc non Af Amer: >60 mL/min (ref >60)
GLUCOSE: 93 mg/dL (ref 65–99)
POTASSIUM: 3.8 mmol/L (ref 3.5–5.1)
SODIUM: 137 mmol/L (ref 135–145)
TOTAL PROTEIN: 7.1 g/dL (ref 6.5–8.1)
Total Bilirubin: 2.1 mg/dL — ABNORMAL HIGH (ref 0.3–1.2)

## 2016-01-30 LAB — URINALYSIS, ROUTINE W REFLEX MICROSCOPIC
Glucose, UA: NEGATIVE mg/dL
Hgb urine dipstick: NEGATIVE
Ketones, ur: 40 mg/dL — AB
Leukocytes, UA: NEGATIVE
NITRITE: NEGATIVE
PH: 5 (ref 5.0–8.0)
Protein, ur: NEGATIVE mg/dL
SPECIFIC GRAVITY, URINE: 1.026 (ref 1.005–1.030)

## 2016-01-30 LAB — CBC
HEMATOCRIT: 41 % (ref 39.0–52.0)
HEMOGLOBIN: 14.1 g/dL (ref 13.0–17.0)
MCH: 32.1 pg (ref 26.0–34.0)
MCHC: 34.4 g/dL (ref 30.0–36.0)
MCV: 93.4 fL (ref 78.0–100.0)
Platelets: 263 10*3/uL (ref 150–400)
RBC: 4.39 MIL/uL (ref 4.22–5.81)
RDW: 13 % (ref 11.5–15.5)
WBC: 8.9 10*3/uL (ref 4.0–10.5)

## 2016-01-30 LAB — LIPASE, BLOOD: Lipase: 18 U/L (ref 11–51)

## 2016-01-30 NOTE — ED Notes (Addendum)
Pt reports right sided abdominal pain onset last night. He reports the pain only comes after drinking anything, including water and lasts for approximately 3 minutes. He has not eaten today. Denies vomiting but reports loose stools.

## 2016-01-30 NOTE — ED Provider Notes (Signed)
CSN: [id number]     Arrival date & time 01/30/16  1831 History  By signing my name below, I, Arianna Nassar, attest that this documentation has been prepared under the direction and in the presence of Tomasita Crumble, MD. Electronically Signed: Octavia Heir, ED Scribe. 01/30/2016. 11:57 PM.    Chief Complaint  Patient presents with  . Abdominal Pain      The history is provided by the patient. No language interpreter was used.   HPI Comments: Dwayne Miller is a 30 y.o. male who presents to the Emergency Department complaining of sudden onset, intermittent, gradual worsening, sharp, 10/10, right sided abdominal cramping onset last night. Pt says it feels as if his stomach is being twisted and stabbed. Each episode will last about 3 minutes. He has not been able to eat or drink anything without having abdominal pain. Pt says he reports having occasional melena.  Denies fever, blood in stool, urinating problems, constipation, back pain or recent surgeries.  History reviewed. No pertinent past medical history. History reviewed. No pertinent past surgical history. No family history on file. Social History  Substance Use Topics  . Smoking status: Current Every Day Smoker -- 1.00 packs/day    Types: Cigarettes  . Smokeless tobacco: None  . Alcohol Use: 9.0 oz/week    15 Cans of beer per week    Review of Systems  A complete 10 system review of systems was obtained and all systems are negative except as noted in the HPI and PMH.    Allergies  Penicillins  Home Medications   Prior to Admission medications   Medication Sig Start Date End Date Taking? Authorizing Provider  acetaminophen (TYLENOL) 500 MG tablet Take 1,000 mg by mouth every 6 (six) hours as needed for pain.    Historical Provider, MD   Triage vitals: BP 138/83 mmHg  Pulse 88  Temp(Src) 98.7 F (37.1 C) (Oral)  Resp 16  SpO2 98% Physical Exam  Constitutional: He is oriented to person, place, and time. Vital signs  are normal. He appears well-developed and well-nourished.  Non-toxic appearance. He does not appear ill. No distress.  HENT:  Head: Normocephalic and atraumatic.  Nose: Nose normal.  Mouth/Throat: Oropharynx is clear and moist. No oropharyngeal exudate.  Eyes: Conjunctivae and EOM are normal. Pupils are equal, round, and reactive to light. No scleral icterus.  Neck: Normal range of motion. Neck supple. No tracheal deviation, no edema, no erythema and normal range of motion present. No thyroid mass and no thyromegaly present.  Cardiovascular: Normal rate, regular rhythm, S1 normal, S2 normal, normal heart sounds, intact distal pulses and normal pulses.  Exam reveals no gallop and no friction rub.   No murmur heard. Pulmonary/Chest: Effort normal and breath sounds normal. No respiratory distress. He has no wheezes. He has no rhonchi. He has no rales.  Abdominal: Soft. Normal appearance and bowel sounds are normal. He exhibits no distension, no ascites and no mass. There is no hepatosplenomegaly. There is no tenderness. There is no rebound, no guarding and no CVA tenderness.  Musculoskeletal: Normal range of motion. He exhibits no edema or tenderness.  Lymphadenopathy:    He has no cervical adenopathy.  Neurological: He is alert and oriented to person, place, and time. He has normal strength. No cranial nerve deficit or sensory deficit.  Skin: Skin is warm, dry and intact. No petechiae and no rash noted. He is not diaphoretic. No erythema. No pallor.  Psychiatric: He has a normal mood and  affect. His behavior is normal. Judgment normal.  Nursing note and vitals reviewed.   ED Course  Procedures  DIAGNOSTIC STUDIES: Oxygen Saturation is 98% on RA, normal by my interpretation.  COORDINATION OF CARE:  12:28 AM Discussed treatment plan which includes bentyl and reglan with pt at bedside and pt agreed to plan.  2:08 AM Pt reports his upper abdominal pain has decreased due to medication. He says  the only pain he has is in his lower abdomen.  Labs Review Labs Reviewed  COMPREHENSIVE METABOLIC PANEL - Abnormal; Notable for the following:    CO2 21 (*)    BUN <5 (*)    Total Bilirubin 2.1 (*)    All other components within normal limits  URINALYSIS, ROUTINE W REFLEX MICROSCOPIC (NOT [facility]) - Abnormal; Notable for the following:    Color, Urine AMBER (*)    Bilirubin Urine SMALL (*)    Ketones, ur 40 (*)    All other components within normal limits  LIPASE, BLOOD  CBC    Imaging Review No results found. I have personally reviewed and evaluated these images and lab results as part of my medical decision-making.   EKG Interpretation None      MDM   Final diagnoses:  None    Patient presents to the ED for intermittent abdominal pain.  Labs are unremarkable, I do not believe imaging is warranted.  Physical exam is unremarkable.  He was given bentyl, reglan, ranitidine and sucralfate for treatment.    2:27 AM Upon repeat evaluation, patient states his abdominal pain has improved.  Rec for PCP fu within 3 days.  Bentyl Rx given for home use.  He appears well and in NAD.  VS remain within his normal limits and he is safe for DC.   I personally performed the services described in this documentation, which was scribed in my presence. The recorded information has been reviewed and is accurate.     Tomasita CrumbleAdeleke Rahm Minix, MD 01/31/16 [phone]

## 2016-01-31 MED ORDER — DICYCLOMINE HCL 10 MG PO CAPS
20.0000 mg | ORAL_CAPSULE | Freq: Once | ORAL | Status: AC
  Administered 2016-01-31: 20 mg via ORAL
  Filled 2016-01-31: qty 2

## 2016-01-31 MED ORDER — DICYCLOMINE HCL 10 MG PO CAPS: 10.0000 mg | ORAL_CAPSULE | Freq: Three times a day (TID) | ORAL | Status: DC | PRN

## 2016-01-31 MED ORDER — RANITIDINE HCL 150 MG/10ML PO SYRP
300.0000 mg | ORAL_SOLUTION | Freq: Once | ORAL | Status: AC
  Administered 2016-01-31: 300 mg via ORAL
  Filled 2016-01-31: qty 20

## 2016-01-31 MED ORDER — SUCRALFATE 1 GM/10ML PO SUSP
1.0000 g | Freq: Once | ORAL | Status: AC
  Administered 2016-01-31: 1 g via ORAL
  Filled 2016-01-31: qty 10

## 2016-01-31 MED ORDER — METOCLOPRAMIDE HCL 10 MG PO TABS
10.0000 mg | ORAL_TABLET | Freq: Once | ORAL | Status: AC
  Administered 2016-01-31: 10 mg via ORAL
  Filled 2016-01-31: qty 1

## 2016-01-31 NOTE — Discharge Instructions (Signed)
Abdominal Pain, Adult Mr. Dwayne Miller, take over the counter ranitidine daily for your heartburn.  Take bentyl as prescribed for worsening abdominal pain.  See a primary care doctor within 3 days for close follow up.  If symptoms worsen, come back to the ED immediately. Thank you. Many things can cause belly (abdominal) pain. Most times, the belly pain is not dangerous. Many cases of belly pain can be watched and treated at home. HOME CARE   Do not take medicines that help you go poop (laxatives) unless told to by your doctor.  Only take medicine as told by your doctor.  Eat or drink as told by your doctor. Your doctor will tell you if you should be on a special diet. GET HELP IF:  You do not know what is causing your belly pain.  You have belly pain while you are sick to your stomach (nauseous) or have runny poop (diarrhea).  You have pain while you pee or poop.  Your belly pain wakes you up at night.  You have belly pain that gets worse or better when you eat.  You have belly pain that gets worse when you eat fatty foods.  You have a fever. GET HELP RIGHT AWAY IF:   The pain does not go away within 2 hours.  You keep throwing up (vomiting).  The pain changes and is only in the right or left part of the belly.  You have bloody or tarry looking poop. MAKE SURE YOU:   Understand these instructions.  Will watch your condition.  Will get help right away if you are not doing well or get worse.   This information is not intended to replace advice given to you by your health care provider. Make sure you discuss any questions you have with your health care provider.   Document Released: 03/23/2008 Document Revised: 10/26/2014 Document Reviewed: 06/14/2013 Elsevier Interactive Patient Education [employer]2016 Elsevier Inc.

## 2016-08-16 ENCOUNTER — Emergency Department (HOSPITAL_COMMUNITY)
Admission: EM | Admit: 2016-08-16 | Discharge: 2016-08-16 | Disposition: A | Discharge status: Home / Self Care | Payer: Self-pay | Attending: Emergency Medicine | Admitting: Emergency Medicine

## 2016-08-16 ENCOUNTER — Encounter (HOSPITAL_COMMUNITY): Payer: Self-pay

## 2016-08-16 DIAGNOSIS — F17210 Nicotine dependence, cigarettes, uncomplicated: Secondary | ICD-10-CM | POA: Insufficient documentation

## 2016-08-16 DIAGNOSIS — Z202 Contact with and (suspected) exposure to infections with a predominantly sexual mode of transmission: Secondary | ICD-10-CM

## 2016-08-16 LAB — URINALYSIS, ROUTINE W REFLEX MICROSCOPIC
Bilirubin Urine: NEGATIVE
Glucose, UA: NEGATIVE mg/dL
Hgb urine dipstick: NEGATIVE
Ketones, ur: NEGATIVE mg/dL
Leukocytes, UA: NEGATIVE
Nitrite: NEGATIVE
Protein, ur: NEGATIVE mg/dL
Specific Gravity, Urine: 1.008 (ref 1.005–1.030)
pH: 6 (ref 5.0–8.0)

## 2016-08-16 MED ORDER — METRONIDAZOLE 500 MG PO TABS
2000.0000 mg | ORAL_TABLET | Freq: Once | ORAL | Status: AC
  Administered 2016-08-16: 2000 mg via ORAL
  Filled 2016-08-16: qty 4

## 2016-08-16 MED ORDER — CEFTRIAXONE SODIUM 250 MG IJ SOLR
250.0000 mg | Freq: Once | INTRAMUSCULAR | Status: AC
  Administered 2016-08-16: 250 mg via INTRAMUSCULAR
  Filled 2016-08-16: qty 250

## 2016-08-16 MED ORDER — AZITHROMYCIN 250 MG PO TABS
1000.0000 mg | ORAL_TABLET | Freq: Once | ORAL | Status: AC
  Administered 2016-08-16: 1000 mg via ORAL
  Filled 2016-08-16: qty 4

## 2016-08-16 NOTE — Discharge Instructions (Signed)
Follow up with the health department if your symptoms continue. You still have gonorrhea and chlamydia results pending. These will result in the next 2-3 days. You will be notified if positive. You were empirically treated today for gonorrhea, chlamydia and trichomonas.

## 2016-08-16 NOTE — ED Provider Notes (Signed)
[facility]-EMERGENCY DEPT Provider Note   CSN: [id number] Arrival date & time: 08/16/16  [id number]  By signing my name below, I, Doreatha MartinEva Mathews, attest that this documentation has been prepared under the direction and in the presence of [employer] InstrumentsSamantha Tripp Saxton Chain, PA-C.  Electronically Signed: Doreatha MartinEva Mathews, ED Scribe. 08/16/16. 9:58 AM.     History   Chief Complaint No chief complaint on file.   HPI Dwayne Miller is a 30 y.o. male who presents to the Emergency Department complaining of intermittent penile discharge onset this week with associated mild lower abdominal pain, mild testicular soreness, moderate pruritis and "flaking" to the testicles. Pt states his male sexual partner called him and told him he had a "bacterial infection". Pt states this is his only sexual partner. He states his current symptoms are similar to prior h/o trichomoniasis infection. He denies dysuria.   The history is provided by the patient. No language interpreter was used.    History reviewed. No pertinent past medical history.  There are no active problems to display for this patient.   History reviewed. No pertinent surgical history.     Home Medications    Prior to Admission medications   Medication Sig Start Date End Date Taking? Authorizing Provider  acetaminophen (TYLENOL) 500 MG tablet Take 1,000 mg by mouth every 6 (six) hours as needed for pain.    Historical Provider, MD  dicyclomine (BENTYL) 10 MG capsule Take 1 capsule (10 mg total) by mouth 3 (three) times daily as needed (abdominal cramping). 01/31/16   Tomasita CrumbleAdeleke Oni, MD    Family History No family history on file.  Social History Social History  Substance Use Topics  . Smoking status: Current Every Day Smoker    Packs/day: 1.00    Types: Cigarettes  . Smokeless tobacco: Not on file  . Alcohol use 9.0 oz/week    15 Cans of beer per week     Allergies   Penicillins   Review of Systems Review of Systems A complete 10 system review  of systems was obtained and all systems are negative except as noted in the HPI and PMH.    Physical Exam Updated Vital Signs BP 123/79 (BP Location: Left Arm)   Pulse 85   Temp 98.3 F (36.8 C) (Oral)   Resp 12   Ht 5\' 9"  (1.753 m)   Wt 160 lb (72.6 kg)   SpO2 98%   BMI 23.63 kg/m   Physical Exam  Constitutional: He is oriented to person, place, and time. He appears well-developed and well-nourished. No distress.  HENT:  Head: Normocephalic and atraumatic.  Eyes: Conjunctivae are normal. Right eye exhibits no discharge. Left eye exhibits no discharge. No scleral icterus.  Cardiovascular: Normal rate.   Pulmonary/Chest: Effort normal.  Genitourinary: Testes normal and penis normal. Right testis shows no mass and no tenderness. Left testis shows no mass and no tenderness. No penile erythema or penile tenderness. No discharge found.  Genitourinary Comments: Chaperone present throughout entire exam.    Neurological: He is alert and oriented to person, place, and time. Coordination normal.  Skin: Skin is warm and dry. No rash noted. He is not diaphoretic. No erythema. No pallor.  Psychiatric: He has a normal mood and affect. His behavior is normal.  Nursing note and vitals reviewed.    ED Treatments / Results   DIAGNOSTIC STUDIES: Oxygen Saturation is 98% on RA, normal by my interpretation.    COORDINATION OF CARE: 9:56 AM Discussed treatment plan with pt  at bedside which includes UA, empiric tx for STI and pt agreed to plan.    Labs (all labs ordered are listed, but only abnormal results are displayed) Labs Reviewed  URINALYSIS, ROUTINE W REFLEX MICROSCOPIC (NOT AT [facility])  GC/CHLAMYDIA PROBE AMP ([place]) NOT AT [facility]    EKG  EKG Interpretation None       Radiology No results found.  Procedures Procedures (including critical care time)  Medications Ordered in ED Medications - No data to display   Initial Impression / Assessment and Plan / ED Course  I  have reviewed the triage vital signs and the nursing notes.  Pertinent labs & imaging results that were available during my care of the patient were reviewed by me and considered in my medical decision making (see chart for details).  Clinical Course    Patient treated in the ED for STI with Rocephin, Azithromycin and Flagyl. Pt has documented Penicillin allergy, but has had Ceftriaxone multiple times in the past without adverse reaction. Patient advised to inform and treat all sexual partners.  Pt advised on safe sex practices and understands that they have GC/Chlamydia cultures pending and will result in 2-3 days. Pt encouraged to follow up at local health department for future STI checks. No concern for prostatitis or epididymitis. Discussed return precautions. Pt appears safe for discharge.     Final Clinical Impressions(s) / ED Diagnoses   Final diagnoses:  STD exposure    New Prescriptions New Prescriptions   No medications on file    I personally performed the services described in this documentation, which was scribed in my presence. The recorded information has been reviewed and is accurate.      Lester KinsmanSamantha Tripp OpalDowless, PA-C 08/16/16 1045    Geoffery Lyonsouglas Delo, MD 08/16/16 1421

## 2016-08-16 NOTE — ED Notes (Signed)
Pt reports itching, flaking and burning to penis. Pt also reports frequent urination and painful erections.

## 2016-08-16 NOTE — ED Triage Notes (Signed)
Patient here requesting treatment for STD. States that he is having penile itching and burning

## 2016-08-17 LAB — GC/CHLAMYDIA PROBE AMP (~~LOC~~) NOT AT ARMC
Chlamydia: NEGATIVE
Neisseria Gonorrhea: NEGATIVE

## 2016-10-29 ENCOUNTER — Emergency Department (HOSPITAL_COMMUNITY)
Admission: EM | Admit: 2016-10-29 | Discharge: 2016-10-29 | Disposition: A | Discharge status: Home / Self Care | Payer: Self-pay | Attending: Emergency Medicine | Admitting: Emergency Medicine

## 2016-10-29 ENCOUNTER — Encounter (HOSPITAL_COMMUNITY): Payer: Self-pay

## 2016-10-29 DIAGNOSIS — F17210 Nicotine dependence, cigarettes, uncomplicated: Secondary | ICD-10-CM | POA: Insufficient documentation

## 2016-10-29 DIAGNOSIS — R1031 Right lower quadrant pain: Secondary | ICD-10-CM | POA: Insufficient documentation

## 2016-10-29 DIAGNOSIS — F419 Anxiety disorder, unspecified: Secondary | ICD-10-CM | POA: Insufficient documentation

## 2016-10-29 NOTE — ED Triage Notes (Signed)
Per EMS-anxiety attack about 2 hours ago-no history of anxiety-complaining to testicular pain-lungs clear

## 2016-10-29 NOTE — Discharge Instructions (Signed)
Please follow up with surgeon if your symptoms persist

## 2016-10-29 NOTE — ED Triage Notes (Signed)
Patient c/o right groin pain and right testicular pain x and pain is worse when he sits or bends patient denies Urinary symptoms or penile drainage.

## 2016-10-30 NOTE — ED Provider Notes (Signed)
[facility]-EMERGENCY DEPT Provider Note   CSN: [id number] Arrival date & time: 10/29/16  0940     History   Chief Complaint Chief Complaint  Patient presents with  . Anxiety  . Testicle Pain  . Groin Pain    HPI Dwayne Miller is a 31 y.o. male who presents with anxiety and right testicular/groin pain. No significant PMH. He states that he was on the bus today going to work and he suddenly felt SOB and very anxious. He got off the bus and called EMS. He states he has been going through a lot of life stress. He states he used to go out an party and that's how he would de-stress. Now a days however he just goes to work and goes back home and feels like he has no life. He feels like he has a lot of energy pent up and has no way to release it. He has 6 kids as well which adds to his stress. He states he has never tried any medicines for anxiety or talked to a therapist. He denies any hobbies. Denies SI/HI, AVH.  Additionally he states he wants to get his testicular pain checked. He states he has it intermittently for about a month. It occurs sometimes when he sits down or squats but not every time. The pain shoots from his right testicle to his right groin. 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  He denies fever, chills, headache, chest pain, cough, wheezing, abdominal pain, N/V, dysuria, penile discharge.  HPI  History reviewed. No pertinent past medical history.  There are no active problems to display for this patient.   History reviewed. No pertinent surgical history.    Home Medications    Prior to Admission medications   Not on File    Family History No family history on file.  Social History Social History  Substance Use Topics  . Smoking status: Current Every Day Smoker    Packs/day: 1.00    Types: Cigarettes  . Smokeless tobacco: Not on file  . Alcohol use 9.0 oz/week    15 Cans of beer per week     Allergies     Penicillins   Review of Systems Review of Systems  Constitutional: Negative for chills and fever.  Respiratory: Positive for shortness of breath (resolved). Negative for cough and wheezing.   Cardiovascular: Negative for chest pain.  Gastrointestinal: Negative for abdominal pain, nausea and vomiting.  Genitourinary: Positive for testicular pain. Negative for discharge, dysuria, penile pain, penile swelling and scrotal swelling.  Psychiatric/Behavioral: Positive for dysphoric mood. Negative for hallucinations, self-injury and suicidal ideas. The patient is nervous/anxious and is hyperactive.   All other systems reviewed and are negative.    Physical Exam Updated Vital Signs BP 114/75 (BP Location: Left Arm)   Pulse 78   Temp 98.3 F (36.8 C) (Oral)   Resp 18   Ht 5\' 9"  (1.753 m)   Wt 74.8 kg   SpO2 99%   BMI 24.37 kg/m   Physical Exam  Constitutional: He is oriented to person, place, and time. He appears well-developed and well-nourished. No distress.  Talkative, hyperactive, NAD  HENT:  Head: Normocephalic and atraumatic.  Eyes: Conjunctivae are normal. Pupils are equal, round, and reactive to light. Right eye exhibits no discharge. Left eye exhibits no discharge. No scleral icterus.  Neck: Normal range of motion.  Cardiovascular: Normal rate and regular rhythm.  Exam reveals no gallop and no friction rub.  No murmur heard. Pulmonary/Chest: Effort normal and breath sounds normal. No respiratory distress. He has no wheezes. He has no rales. He exhibits no tenderness.  Abdominal: Soft. Bowel sounds are normal. He exhibits no distension and no mass. There is no tenderness. There is no rebound and no guarding. No hernia.  Genitourinary:  Genitourinary Comments: No inguinal lymphadenopathy. Possible right inguinal hernia felt with coughing. Normal circumcised penis free of lesions or rash. Testicles are nontender with normal lie. Normal scrotal appearance. No obvious discharge  noted. Chaperone present during exam.    Neurological: He is alert and oriented to person, place, and time.  Skin: Skin is warm and dry.  Psychiatric: His mood appears anxious. His speech is rapid and/or pressured. He is hyperactive. He expresses no suicidal ideation.  Nursing note and vitals reviewed.    ED Treatments / Results  Labs (all labs ordered are listed, but only abnormal results are displayed) Labs Reviewed - No data to display  EKG  EKG Interpretation None       Radiology No results found.  Procedures Procedures (including critical care time)  Medications Ordered in ED Medications - No data to display   Initial Impression / Assessment and Plan / ED Course  I have reviewed the triage vital signs and the nursing notes.  Pertinent labs & imaging results that were available during my care of the patient were reviewed by me and considered in my medical decision making (see chart for details).  Clinical Course    31 year old male with anxiety and testicular/groin pain. He has very good insight to why he is anxious and knows he needs to make lifestyle changes to help with his mood. Do not feel any medication will be helpful for this currently. Provided reassurance and encouraged developing healthy hobbies and to exercise to help deal with excess energy.   Testicular pain/groin pain is likely due to hernia. Low suspicion for infection. He is currently asymptomatic - will General surgery follow up given. Patient is NAD, non-toxic, with stable VS. Patient is informed of clinical course, understands medical decision making process, and agrees with plan. Opportunity for questions provided and all questions answered. Return precautions given.   Final Clinical Impressions(s) / ED Diagnoses   Final diagnoses:  Anxiety  Right groin pain    New Prescriptions There are no discharge medications for this patient.    Bethel Born, PA-C 10/30/16 1308    Shaune Pollack, MD 10/31/16 [phone]

## 2017-03-22 ENCOUNTER — Emergency Department (HOSPITAL_COMMUNITY)
Admission: EM | Admit: 2017-03-22 | Discharge: 2017-03-22 | Disposition: A | Discharge status: Home / Self Care | Payer: Self-pay | Attending: Emergency Medicine | Admitting: Emergency Medicine

## 2017-03-22 ENCOUNTER — Encounter (HOSPITAL_COMMUNITY): Payer: Self-pay | Admitting: Emergency Medicine

## 2017-03-22 DIAGNOSIS — F17210 Nicotine dependence, cigarettes, uncomplicated: Secondary | ICD-10-CM | POA: Insufficient documentation

## 2017-03-22 DIAGNOSIS — M62838 Other muscle spasm: Secondary | ICD-10-CM | POA: Insufficient documentation

## 2017-03-22 DIAGNOSIS — Z79899 Other long term (current) drug therapy: Secondary | ICD-10-CM | POA: Insufficient documentation

## 2017-03-22 LAB — CBC
HEMATOCRIT: 42.8 % (ref 39.0–52.0)
Hemoglobin: 15.1 g/dL (ref 13.0–17.0)
MCH: 32.4 pg (ref 26.0–34.0)
MCHC: 35.3 g/dL (ref 30.0–36.0)
MCV: 91.8 fL (ref 78.0–100.0)
Platelets: 296 10*3/uL (ref 150–400)
RBC: 4.66 MIL/uL (ref 4.22–5.81)
RDW: 13.6 % (ref 11.5–15.5)
WBC: 9.6 10*3/uL (ref 4.0–10.5)

## 2017-03-22 LAB — URINALYSIS, ROUTINE W REFLEX MICROSCOPIC
Bilirubin Urine: NEGATIVE
GLUCOSE, UA: NEGATIVE mg/dL
Hgb urine dipstick: NEGATIVE
Ketones, ur: 20 mg/dL — AB
LEUKOCYTES UA: NEGATIVE
Nitrite: NEGATIVE
PH: 5 (ref 5.0–8.0)
Protein, ur: NEGATIVE mg/dL
SPECIFIC GRAVITY, URINE: 1.021 (ref 1.005–1.030)

## 2017-03-22 LAB — BASIC METABOLIC PANEL
Anion gap: 10 (ref 5–15)
BUN: 12 mg/dL (ref 6–20)
CHLORIDE: 100 mmol/L — AB (ref 101–111)
CO2: 24 mmol/L (ref 22–32)
Calcium: 8.8 mg/dL — ABNORMAL LOW (ref 8.9–10.3)
Creatinine, Ser: 0.9 mg/dL (ref 0.61–1.24)
GFR calc non Af Amer: >60 mL/min (ref >60)
Glucose, Bld: 99 mg/dL (ref 65–99)
POTASSIUM: 4.1 mmol/L (ref 3.5–5.1)
SODIUM: 134 mmol/L — AB (ref 135–145)

## 2017-03-22 NOTE — ED Provider Notes (Addendum)
[facility]-EMERGENCY DEPT Provider Note   CSN: [id number] Arrival date & time: 03/22/17  0803     History   Chief Complaint Chief Complaint  Patient presents with  . Weakness    HPI Dwayne Miller is a 31 y.o. male.  Patient presents with cramping in his legs and minimal weakness. He walks a lot to and from his job and stands all day.  Cramping appears to be mostly in his bilateral posterior thighs. No fever, sweats, chills, weight loss, dysuria, cough. He is normally healthy. Severity symptoms is mild.      History reviewed. No pertinent past medical history.  There are no active problems to display for this patient.   History reviewed. No pertinent surgical history.     Home Medications    Prior to Admission medications   Medication Sig Start Date End Date Taking? Authorizing Provider  acetaminophen (TYLENOL) 325 MG tablet Take 325 mg by mouth every 6 (six) hours as needed.   Yes [provider]    Family History No family history on file.  Social History Social History  Substance Use Topics  . Smoking status: Current Every Day Smoker    Packs/day: 1.00    Types: Cigarettes  . Smokeless tobacco: Not on file  . Alcohol use 9.0 oz/week    15 Cans of beer per week     Allergies   Penicillins   Review of Systems Review of Systems  All other systems reviewed and are negative.    Physical Exam Updated Vital Signs BP (!) 100/57 (BP Location: Left Arm)   Pulse 72   Temp 98.5 F (36.9 C) (Oral)   Resp 18   SpO2 100%   Physical Exam  Constitutional: He is oriented to person, place, and time. He appears well-developed and well-nourished.  HENT:  Head: Normocephalic and atraumatic.  Eyes: Conjunctivae are normal.  Neck: Neck supple.  Cardiovascular: Normal rate and regular rhythm.   Pulmonary/Chest: Effort normal and breath sounds normal.  Abdominal: Soft. Bowel sounds are normal.  Musculoskeletal: Normal range of motion.    Neurological: He is alert and oriented to person, place, and time.  Skin: Skin is warm and dry.  Psychiatric: He has a normal mood and affect. His behavior is normal.  Nursing note and vitals reviewed.    ED Treatments / Results  Labs (all labs ordered are listed, but only abnormal results are displayed) Labs Reviewed  BASIC METABOLIC PANEL - Abnormal; Notable for the following:       Result Value   Sodium 134 (*)    Chloride 100 (*)    Calcium 8.8 (*)    All other components within normal limits  URINALYSIS, ROUTINE W REFLEX MICROSCOPIC - Abnormal; Notable for the following:    Ketones, ur 20 (*)    All other components within normal limits  CBC  CBG MONITORING, ED    EKG  EKG Interpretation None       Radiology No results found.  Procedures Procedures (including critical care time)  Medications Ordered in ED Medications - No data to display   Initial Impression / Assessment and Plan / ED Course  I have reviewed the triage vital signs and the nursing notes.  Pertinent labs & imaging results that were available during my care of the patient were reviewed by me and considered in my medical decision making (see chart for details).     Patient looks well and has a normal physical exam. Screening tests  reveal no acute abnormalities.  Final Clinical Impressions(s) / ED Diagnoses   Final diagnoses:  Muscle spasms of both lower extremities    New Prescriptions New Prescriptions   No medications on file     Donnetta Hutching, MD 03/24/17 1305    Donnetta Hutching, MD 03/24/17 [phone]

## 2017-03-22 NOTE — ED Notes (Signed)
Pt reports muscle spasms in both legs that wake him up at night. Pt states they are very painful and are happening frequently.

## 2017-03-22 NOTE — ED Notes (Signed)
Bed: WA07 Expected date:  Expected time:  Means of arrival:  Comments: 

## 2017-03-22 NOTE — ED Triage Notes (Signed)
Pt complaint of generalized weakness and cramps onset 2 days ago; "one my feet a lot;" pt denies injury.

## 2017-03-22 NOTE — ED Triage Notes (Signed)
Pt arrived via ems, with reports with generalized weakness and fatigue. 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 About 2 days ago began having severe leg cramps and fatigues. Denies drinking enough water. Denies any injuries to legs. Denies fever and other pain. VS 120/76, 80, 18, 100%RA.

## 2017-03-22 NOTE — Discharge Instructions (Signed)
Blood work and urine sample were normal. Recommend hydration and stretching activities

## 2017-05-10 ENCOUNTER — Encounter (HOSPITAL_COMMUNITY): Payer: Self-pay | Admitting: Emergency Medicine

## 2017-05-10 ENCOUNTER — Ambulatory Visit (HOSPITAL_COMMUNITY)
Admission: EM | Admit: 2017-05-10 | Discharge: 2017-05-10 | Disposition: A | Discharge status: Home / Self Care | Payer: 59 | Attending: Internal Medicine | Admitting: Internal Medicine

## 2017-05-10 DIAGNOSIS — R112 Nausea with vomiting, unspecified: Secondary | ICD-10-CM

## 2017-05-10 DIAGNOSIS — R197 Diarrhea, unspecified: Secondary | ICD-10-CM

## 2017-05-10 MED ORDER — ONDANSETRON HCL 4 MG PO TABS: 4.0000 mg | ORAL_TABLET | Freq: Four times a day (QID) | ORAL | 0 refills | Status: DC

## 2017-05-10 NOTE — ED Triage Notes (Signed)
Abdominal pain , nausea, vomiting and diarrhea.  Symptoms woke patient during the night.  Symptoms continued today

## 2017-05-10 NOTE — Discharge Instructions (Signed)
I have given you some zofran for nausea and vomiting. Keep hydrated, your urine should be a clear/pale yellow color. Monitor for worsening of symptoms such as abdominal pain, blood in stool, fever.

## 2017-05-10 NOTE — ED Provider Notes (Signed)
CSN: [id number]     Arrival date & time 05/10/17  [id number] History   None    Chief Complaint  Patient presents with  . Abdominal Pain   (Consider location/radiation/quality/duration/timing/severity/associated sxs/prior Treatment) 31 year old male comes in for a one-day history of nausea, vomiting, diarrhea. Started late last night, has had 2 episodes of vomiting, and 4 episodes of diarrhea. States he had a cookout yesterday, and thinks it could be the cause. States his daughter had similar symptoms last week, so had exposure to that as well. Had subjective fever. Was able to keep water down today. He states he doesn't have any abdominal pain at rest, but does experience some abdominal cramping prior to diarrhea episodes. States stool is runny, denies blood in stool. Denies foul smell. No recent antibiotic use.       History reviewed. No pertinent past medical history. History reviewed. No pertinent surgical history. No family history on file. Social History  Substance Use Topics  . Smoking status: Current Every Day Smoker    Packs/day: 1.00    Types: Cigarettes  . Smokeless tobacco: Not on file  . Alcohol use 9.0 oz/week    15 Cans of beer per week    Review of Systems  Constitutional: Negative for chills, diaphoresis and fever.  Gastrointestinal: Positive for abdominal pain, diarrhea, nausea and vomiting. Negative for blood in stool and constipation.    Allergies  Penicillins  Home Medications   Prior to Admission medications   Medication Sig Start Date End Date Taking? Authorizing Provider  acetaminophen (TYLENOL) 325 MG tablet Take 325 mg by mouth every 6 (six) hours as needed.    [provider]  ondansetron (ZOFRAN) 4 MG tablet Take 1 tablet (4 mg total) by mouth every 6 (six) hours. 05/10/17   Belinda FisherYu, Derrisha Foos V, PA-C   Meds Ordered and Administered this Visit  Medications - No data to display  BP 114/72 (BP Location: Left Arm)   Pulse 83   Temp 98.4 F (36.9 C)  (Oral)   Resp 18   SpO2 98%  No data found.   Physical Exam  Constitutional: He is oriented to person, place, and time. He appears well-developed and well-nourished. No distress.  HENT:  Head: Normocephalic and atraumatic.  Eyes: Pupils are equal, round, and reactive to light. Conjunctivae are normal.  Cardiovascular: Normal rate, regular rhythm and normal heart sounds.  Exam reveals no gallop and no friction rub.   No murmur heard. Pulmonary/Chest: Effort normal and breath sounds normal. He has no wheezes. He has no rales.  Abdominal: Soft. Bowel sounds are normal. He exhibits no distension and no mass. There is no tenderness. There is no rebound and no guarding.  Neurological: He is alert and oriented to person, place, and time.  Skin: Skin is warm and dry.  Psychiatric: He has a normal mood and affect. His behavior is normal. Judgment normal.    Urgent Care Course     Procedures (including critical care time)  Labs Review Labs Reviewed - No data to display  Imaging Review No results found.    MDM   1. Nausea vomiting and diarrhea    Discussed with patient given exam without abdominal pain, no suspicion for acute abdomen right now. We will give Zofran for symptomatic treatment. Patient to keep hydrated. Resources and instructions on bland diet given. To monitor for worsening of symptoms, fever, persistent abdominal pain, blood in the stool, follow up for reevaluation.   Belinda FisherYu, Dao Mearns V, PA-C 05/10/17  1038  

## 2017-05-16 ENCOUNTER — Encounter (HOSPITAL_COMMUNITY): Payer: Self-pay | Admitting: Emergency Medicine

## 2017-05-16 ENCOUNTER — Emergency Department (HOSPITAL_COMMUNITY)
Admission: EM | Admit: 2017-05-16 | Discharge: 2017-05-17 | Disposition: A | Discharge status: Home / Self Care | Payer: 59 | Attending: Emergency Medicine | Admitting: Emergency Medicine

## 2017-05-16 DIAGNOSIS — R1084 Generalized abdominal pain: Secondary | ICD-10-CM | POA: Diagnosis not present

## 2017-05-16 DIAGNOSIS — R197 Diarrhea, unspecified: Secondary | ICD-10-CM | POA: Insufficient documentation

## 2017-05-16 DIAGNOSIS — F17210 Nicotine dependence, cigarettes, uncomplicated: Secondary | ICD-10-CM | POA: Diagnosis not present

## 2017-05-16 LAB — URINALYSIS, ROUTINE W REFLEX MICROSCOPIC
BILIRUBIN URINE: NEGATIVE
GLUCOSE, UA: NEGATIVE mg/dL
Hgb urine dipstick: NEGATIVE
KETONES UR: NEGATIVE mg/dL
Leukocytes, UA: NEGATIVE
NITRITE: NEGATIVE
PH: 6 (ref 5.0–8.0)
PROTEIN: NEGATIVE mg/dL
Specific Gravity, Urine: 1.004 — ABNORMAL LOW (ref 1.005–1.030)

## 2017-05-16 LAB — COMPREHENSIVE METABOLIC PANEL
ALBUMIN: 3.8 g/dL (ref 3.5–5.0)
ALK PHOS: 87 U/L (ref 38–126)
ALT: 20 U/L (ref 17–63)
AST: 28 U/L (ref 15–41)
Anion gap: 11 (ref 5–15)
BILIRUBIN TOTAL: 0.8 mg/dL (ref 0.3–1.2)
BUN: 8 mg/dL (ref 6–20)
CO2: 22 mmol/L (ref 22–32)
CREATININE: 1.02 mg/dL (ref 0.61–1.24)
Calcium: 8.8 mg/dL — ABNORMAL LOW (ref 8.9–10.3)
Chloride: 105 mmol/L (ref 101–111)
GFR calc Af Amer: >60 mL/min (ref >60)
GLUCOSE: 81 mg/dL (ref 65–99)
POTASSIUM: 3.8 mmol/L (ref 3.5–5.1)
Sodium: 138 mmol/L (ref 135–145)
TOTAL PROTEIN: 7 g/dL (ref 6.5–8.1)

## 2017-05-16 LAB — CBC
HEMATOCRIT: 39.8 % (ref 39.0–52.0)
Hemoglobin: 13.4 g/dL (ref 13.0–17.0)
MCH: 31.4 pg (ref 26.0–34.0)
MCHC: 33.7 g/dL (ref 30.0–36.0)
MCV: 93.2 fL (ref 78.0–100.0)
PLATELETS: 299 10*3/uL (ref 150–400)
RBC: 4.27 MIL/uL (ref 4.22–5.81)
RDW: 13.7 % (ref 11.5–15.5)
WBC: 10.4 10*3/uL (ref 4.0–10.5)

## 2017-05-16 LAB — LIPASE, BLOOD: Lipase: 24 U/L (ref 11–51)

## 2017-05-16 NOTE — ED Triage Notes (Signed)
Reports N/V/D for a week.  [facility].  Nausea and vomiting resolved but continues to have diarrhea every time he eats.  Denies any pain at this time.

## 2017-05-17 NOTE — Discharge Instructions (Signed)
You can be discharged home. Push fluids. Take Imodium only if you are having more than 5 bowel movements a day. Symptoms should continue to improve and resolve.

## 2017-05-17 NOTE — ED Provider Notes (Signed)
[facility]-EM[facility] DEPT Provider Note   CSN: [id number] Arrival date & time: 05/16/17  2154     History   Chief Complaint Chief Complaint  Patient presents with  . Abdominal Pain  . Diarrhea    HPI Dwayne Miller is a 31 y.o. male.  Patient presents with persistent diarrhea x 7 days. He reports [facility] and subsequently had a nausea, vomiting, diarrhea illness for which he was seen at Urgent Care. He was given Zofran and reports his nausea and vomiting has resolved. He is eating and drinking per normal. He states he was concerned that he is continuing to have 2 loose, non-bloody BM's daily associated with significant abdominal cramping prior to a bowel movement. No known fever.    The history is provided by the patient. No language interpreter was used.    History reviewed. No pertinent past medical history.  There are no active problems to display for this patient.   History reviewed. No pertinent surgical history.     Home Medications    Prior to Admission medications   Medication Sig Start Date End Date Taking? Authorizing Provider  acetaminophen (TYLENOL) 325 MG tablet Take 325 mg by mouth every 6 (six) hours as needed.   Yes [provider]  ondansetron (ZOFRAN) 4 MG tablet Take 1 tablet (4 mg total) by mouth every 6 (six) hours. 05/10/17  Yes Belinda FisherYu, Amy V, PA-C    Family History No family history on file.  Social History Social History  Substance Use Topics  . Smoking status: Current Every Day Smoker    Packs/day: 1.00    Types: Cigarettes  . Smokeless tobacco: Never Used  . Alcohol use 9.0 oz/week    15 Cans of beer per week     Allergies   Penicillins   Review of Systems Review of Systems  Constitutional: Negative for chills and fever.  Respiratory: Negative.  Negative for shortness of breath.   Cardiovascular: Negative.  Negative for chest pain.  Gastrointestinal: Positive for abdominal pain and diarrhea.  Negative for nausea and vomiting.  Genitourinary: Negative for decreased urine volume.  Musculoskeletal: Negative.  Negative for myalgias.  Neurological: Negative.      Physical Exam Updated Vital Signs BP 106/69   Pulse 84   Temp 98.1 F (36.7 C) (Oral)   Resp 16   Ht 5\' 8"  (1.727 m)   Wt 70.3 kg (155 lb)   SpO2 97%   BMI 23.57 kg/m   Physical Exam  Constitutional: He is oriented to person, place, and time. He appears well-developed and well-nourished.  Well appearing.   HENT:  Head: Normocephalic.  Mouth/Throat: Oropharynx is clear and moist.  Neck: Normal range of motion. Neck supple.  Cardiovascular: Normal rate.   Pulmonary/Chest: Effort normal.  Abdominal: Soft. Bowel sounds are normal. He exhibits no distension. There is no tenderness. There is no rebound and no guarding.  Musculoskeletal: Normal range of motion.  Neurological: He is alert and oriented to person, place, and time.  Skin: Skin is warm and dry.  Psychiatric: He has a normal mood and affect.     ED Treatments / Results  Labs (all labs ordered are listed, but only abnormal results are displayed) Labs Reviewed  COMPREHENSIVE METABOLIC PANEL - Abnormal; Notable for the following:       Result Value   Calcium 8.8 (*)    All other components within normal limits  URINALYSIS, ROUTINE W REFLEX MICROSCOPIC -  Abnormal; Notable for the following:    Color, Urine STRAW (*)    Specific Gravity, Urine 1.004 (*)    All other components within normal limits  LIPASE, BLOOD  CBC   Results for orders placed or performed during the hospital encounter of 05/16/17  Lipase, blood  Result Value Ref Range   Lipase 24 11 - 51 U/L  Comprehensive metabolic panel  Result Value Ref Range   Sodium 138 135 - 145 mmol/L   Potassium 3.8 3.5 - 5.1 mmol/L   Chloride 105 101 - 111 mmol/L   CO2 22 22 - 32 mmol/L   Glucose, Bld 81 65 - 99 mg/dL   BUN 8 6 - 20 mg/dL   Creatinine, Ser 9.561.02 0.61 - 1.24 mg/dL   Calcium 8.8  (L) 8.9 - 10.3 mg/dL   Total Protein 7.0 6.5 - 8.1 g/dL   Albumin 3.8 3.5 - 5.0 g/dL   AST 28 15 - 41 U/L   ALT 20 17 - 63 U/L   Alkaline Phosphatase 87 38 - 126 U/L   Total Bilirubin 0.8 0.3 - 1.2 mg/dL   GFR calc non Af Amer >60 >60 mL/min   GFR calc Af Amer >60 >60 mL/min   Anion gap 11 5 - 15  CBC  Result Value Ref Range   WBC 10.4 4.0 - 10.5 K/uL   RBC 4.27 4.22 - 5.81 MIL/uL   Hemoglobin 13.4 13.0 - 17.0 g/dL   HCT 21.339.8 08.639.0 - 57.852.0 %   MCV 93.2 78.0 - 100.0 fL   MCH 31.4 26.0 - 34.0 pg   MCHC 33.7 30.0 - 36.0 g/dL   RDW 46.913.7 62.911.5 - 52.815.5 %   Platelets 299 150 - 400 K/uL  Urinalysis, Routine w reflex microscopic  Result Value Ref Range   Color, Urine STRAW (A) YELLOW   APPearance CLEAR CLEAR   Specific Gravity, Urine 1.004 (L) 1.005 - 1.030   pH 6.0 5.0 - 8.0   Glucose, UA NEGATIVE NEGATIVE mg/dL   Hgb urine dipstick NEGATIVE NEGATIVE   Bilirubin Urine NEGATIVE NEGATIVE   Ketones, ur NEGATIVE NEGATIVE mg/dL   Protein, ur NEGATIVE NEGATIVE mg/dL   Nitrite NEGATIVE NEGATIVE   Leukocytes, UA NEGATIVE NEGATIVE    EKG  EKG Interpretation None       Radiology No results found.  Procedures Procedures (including critical care time)  Medications Ordered in ED Medications - No data to display   Initial Impression / Assessment and Plan / ED Course  I have reviewed the triage vital signs and the nursing notes.  Pertinent labs & imaging results that were available during my care of the patient were reviewed by me and considered in my medical decision making (see chart for details).     Patient presents with symptoms twice daily diarrhea that is loose/watery, non-bloody, x 7 days. He describes abdominal cramping prior to bowel movement.   He has a benign abdomen to exam. Labs reassuring. VSS. No tachycardia. IVF's provided - 1 liter.  He is requesting food and drink. He is sleeping well in between serial exams.   He is felt stable for discharge with improving  symptoms of gastroenteritis. Will provide Bentyl for abdominal cramping. Return precautions discussed.     Final Clinical Impressions(s) / ED Diagnoses   Final diagnoses:  None   1. Diarrhea  New Prescriptions New Prescriptions   No medications on file     Elpidio AnisUpstill, Kmari Brian, Cordelia Poche-C 05/17/17 0222    Rancour, Jeannett SeniorStephen,  MD 05/17/17 [phone]

## 2017-06-18 ENCOUNTER — Encounter (HOSPITAL_COMMUNITY): Payer: Self-pay | Admitting: *Deleted

## 2017-06-18 ENCOUNTER — Emergency Department (HOSPITAL_COMMUNITY)
Admission: EM | Admit: 2017-06-18 | Discharge: 2017-06-18 | Disposition: A | Discharge status: Home / Self Care | Payer: 59 | Attending: Emergency Medicine | Admitting: Emergency Medicine

## 2017-06-18 DIAGNOSIS — F320 Major depressive disorder, single episode, mild: Secondary | ICD-10-CM

## 2017-06-18 DIAGNOSIS — F17210 Nicotine dependence, cigarettes, uncomplicated: Secondary | ICD-10-CM | POA: Insufficient documentation

## 2017-06-18 DIAGNOSIS — Z048 Encounter for examination and observation for other specified reasons: Secondary | ICD-10-CM | POA: Diagnosis not present

## 2017-06-18 DIAGNOSIS — Z79899 Other long term (current) drug therapy: Secondary | ICD-10-CM | POA: Diagnosis not present

## 2017-06-18 DIAGNOSIS — F329 Major depressive disorder, single episode, unspecified: Secondary | ICD-10-CM | POA: Diagnosis present

## 2017-06-18 LAB — CBC WITH DIFFERENTIAL/PLATELET
BASOS ABS: 0 10*3/uL (ref 0.0–0.1)
BASOS PCT: 0 %
EOS ABS: 0.3 10*3/uL (ref 0.0–0.7)
EOS PCT: 3 %
HEMATOCRIT: 41.8 % (ref 39.0–52.0)
Hemoglobin: 13.9 g/dL (ref 13.0–17.0)
Lymphocytes Relative: 32 %
Lymphs Abs: 3.3 10*3/uL (ref 0.7–4.0)
MCH: 31.2 pg (ref 26.0–34.0)
MCHC: 33.3 g/dL (ref 30.0–36.0)
MCV: 93.9 fL (ref 78.0–100.0)
MONO ABS: 0.8 10*3/uL (ref 0.1–1.0)
MONOS PCT: 8 %
Neutro Abs: 5.9 10*3/uL (ref 1.7–7.7)
Neutrophils Relative %: 57 %
PLATELETS: 274 10*3/uL (ref 150–400)
RBC: 4.45 MIL/uL (ref 4.22–5.81)
RDW: 13.9 % (ref 11.5–15.5)
WBC: 10.3 10*3/uL (ref 4.0–10.5)

## 2017-06-18 LAB — COMPREHENSIVE METABOLIC PANEL
ALBUMIN: 4 g/dL (ref 3.5–5.0)
ALK PHOS: 84 U/L (ref 38–126)
ALT: 16 U/L — ABNORMAL LOW (ref 17–63)
AST: 19 U/L (ref 15–41)
Anion gap: 9 (ref 5–15)
BUN: 11 mg/dL (ref 6–20)
CHLORIDE: 103 mmol/L (ref 101–111)
CO2: 24 mmol/L (ref 22–32)
Calcium: 8.8 mg/dL — ABNORMAL LOW (ref 8.9–10.3)
Creatinine, Ser: 1.08 mg/dL (ref 0.61–1.24)
GFR calc non Af Amer: >60 mL/min (ref >60)
GLUCOSE: 99 mg/dL (ref 65–99)
Potassium: 4.1 mmol/L (ref 3.5–5.1)
SODIUM: 136 mmol/L (ref 135–145)
Total Bilirubin: 0.7 mg/dL (ref 0.3–1.2)
Total Protein: 7 g/dL (ref 6.5–8.1)

## 2017-06-18 LAB — RAPID URINE DRUG SCREEN, HOSP PERFORMED
AMPHETAMINES: NOT DETECTED
BARBITURATES: NOT DETECTED
BENZODIAZEPINES: NOT DETECTED
Cocaine: POSITIVE — AB
OPIATES: NOT DETECTED
TETRAHYDROCANNABINOL: POSITIVE — AB

## 2017-06-18 LAB — SALICYLATE LEVEL

## 2017-06-18 LAB — ETHANOL: Alcohol, Ethyl (B): <5 mg/dL (ref <5)

## 2017-06-18 LAB — ACETAMINOPHEN LEVEL: Acetaminophen (Tylenol), Serum: <10 ug/mL — ABNORMAL LOW (ref 10–30)

## 2017-06-18 NOTE — BH Assessment (Signed)
Dwayne ConnJason Berry NP recommends outpatient treatment. Patient is psychiatrically cleared. TTS to fax referrals to [phone]

## 2017-06-18 NOTE — ED Notes (Signed)
Pt speaking with TTS 

## 2017-06-18 NOTE — ED Notes (Signed)
Pt reports he has locked himself in his room for the past 2 days after recently loosing his grandmother who raised him. Pt states he has been feeling depressed, denies SI or HI stating "I feel like I just need to talk to somebody.

## 2017-06-18 NOTE — ED Notes (Signed)
Pt ambulatory at DC, given [facility] in [facility]. NAD. VSS.

## 2017-06-18 NOTE — ED Triage Notes (Signed)
Pt reports recent death in family and pt is having difficulty sleeping at night, feels depressed and difficulty going to work. Denies SI or HI at triage.

## 2017-06-18 NOTE — Discharge Instructions (Signed)
Please follow-up with the resources that are attached with your paperwork to get established with a behavioral health professional.  If you have any new or worsening symptoms, including thoughts of hurting yourself or hurting others, please return to the emergency department for re-evaluation.

## 2017-06-18 NOTE — ED Provider Notes (Signed)
[facility] Provider Note   CSN: [id number] Arrival date & time: 06/18/17  1145     History   Chief Complaint Chief Complaint  Patient presents with  . Depression  . Medical Clearance    HPI Dwayne Miller is a 31 y.o. male.  HPI Patient presents to the emergency department with increasing depression over the last month.  He states that he has had a lot of increasing drinking.  He states that he is having difficulty sleeping.  He states that he lost a family member about a month ago Seemed to get worse.  He states he does have a history of depression but never to this extent.  Patient states he is not suicidal or homicidal at this time. The patient denies chest pain, shortness of breath, headache,blurred vision, neck pain, fever, cough, weakness, numbness, dizziness, anorexia, edema, abdominal pain, nausea, vomiting, diarrhea, rash, back pain, dysuria, hematemesis, bloody stool, near syncope, or syncope. History reviewed. No pertinent past medical history.  There are no active problems to display for this patient.   History reviewed. No pertinent surgical history.     Home Medications    Prior to Admission medications   Medication Sig Start Date End Date Taking? Authorizing Provider  acetaminophen (TYLENOL) 325 MG tablet Take 325 mg by mouth every 6 (six) hours as needed.    [provider]  ondansetron (ZOFRAN) 4 MG tablet Take 1 tablet (4 mg total) by mouth every 6 (six) hours. 05/10/17   Belinda FisherYu, Amy V, PA-C    Family History History reviewed. No pertinent family history.  Social History Social History  Substance Use Topics  . Smoking status: Current Every Day Smoker    Packs/day: 1.00    Types: Cigarettes  . Smokeless tobacco: Never Used  . Alcohol use 9.0 oz/week    15 Cans of beer per week     Allergies   Penicillins   Review of Systems Review of Systems All other systems negative except as documented in the HPI. All pertinent  positives and negatives as reviewed in the HPI.  Physical Exam Updated Vital Signs BP (!) 107/57 (BP Location: Left Arm)   Pulse 71   Temp 97.7 F (36.5 C) (Oral)   Resp 16   SpO2 100%   Physical Exam  Constitutional: He is oriented to person, place, and time. He appears well-developed and well-nourished. No distress.  HENT:  Head: Normocephalic and atraumatic.  Mouth/Throat: Oropharynx is clear and moist.  Eyes: Pupils are equal, round, and reactive to light.  Neck: Normal range of motion. Neck supple.  Cardiovascular: Normal rate, regular rhythm and normal heart sounds.  Exam reveals no gallop and no friction rub.   No murmur heard. Pulmonary/Chest: Effort normal and breath sounds normal. No respiratory distress. He has no wheezes.  Abdominal: Soft. Bowel sounds are normal. He exhibits no distension. There is no tenderness.  Neurological: He is alert and oriented to person, place, and time. He exhibits normal muscle tone. Coordination normal.  Skin: Skin is warm and dry. Capillary refill takes less than 2 seconds. No rash noted. No erythema.  Psychiatric: He has a normal mood and affect. His behavior is normal.  Nursing note and vitals reviewed.    ED Treatments / Results  Labs (all labs ordered are listed, but only abnormal results are displayed) Labs Reviewed  COMPREHENSIVE METABOLIC PANEL - Abnormal; Notable for the following:       Result Value   Calcium 8.8 (*)  ALT 16 (*)    All other components within normal limits  RAPID URINE DRUG SCREEN, HOSP PERFORMED - Abnormal; Notable for the following:    Cocaine POSITIVE (*)    Tetrahydrocannabinol POSITIVE (*)    All other components within normal limits  ACETAMINOPHEN LEVEL - Abnormal; Notable for the following:    Acetaminophen (Tylenol), Serum <10 (*)    All other components within normal limits  ETHANOL  CBC WITH DIFFERENTIAL/PLATELET  SALICYLATE LEVEL    EKG  EKG Interpretation None        Radiology No results found.  Procedures Procedures (including critical care time)  Medications Ordered in ED Medications - No data to display   Initial Impression / Assessment and Plan / ED Course  I have reviewed the triage vital signs and the nursing notes.  Pertinent labs & imaging results that were available during my care of the patient were reviewed by me and considered in my medical decision making (see chart for details).     Patient will need TTS consult.  Otherwise, I feel that the patient could be discharged if they agree with this plan  Final Clinical Impressions(s) / ED Diagnoses   Final diagnoses:  None    New Prescriptions New Prescriptions   No medications on file     Kyra Manges 06/18/17 1945    Benjiman Core, MD 06/19/17 [phone]

## 2017-06-18 NOTE — BH Assessment (Signed)
Tele Assessment Note     Dwayne Miller is an 31 y.o. male presenting to the ED with depressed mood and requesting resources for therapy. The patient states his grandmother died in early August, she was like a mother to him, raising him since the age of 31. He reports since her death he has periods of guilt, isolation, depressed mood, missing days of work. The patient denied SI, HI or A/V. The patient denied drug use but tested positive for cocaine and cannabis.  The patient lives with a family friend. Has a supportive fiance'. States he works at [employer] and likes his job. Reports poor appetite and sleep. The patient had unremarkable appearance, fair eye contact, freedom of movement, logical speech, alert, depressed mood, coherent thought, unimpaired judgement and insight.   Dwayne ConnJason Berry NP recommends outpatient treatment. Patient is psychiatrically cleared. TTS to fax referrals to [phone]  Diagnosis: MDD, single episode, without psychosis; Cocaine use disorder; Cannabis use disorder  Past Medical History: History reviewed. No pertinent past medical history.  History reviewed. No pertinent surgical history.  Family History: History reviewed. No pertinent family history.  Social History:  reports that he has been smoking Cigarettes.  He has been smoking about 1.00 pack per day. He has never used smokeless tobacco. He reports that he drinks about 9.0 oz of alcohol per week . He reports that he does not use drugs.  Additional Social History:  Alcohol / Drug Use Pain Medications: see MAR Prescriptions: see MAR Over the Counter: see MAR History of alcohol / drug use?: Yes Substance #1 Name of Substance 1: cocaine 1 - Age of First Use: UTA 1 - Amount (size/oz): UTA 1 - Frequency: UTA 1 - Duration: UTA 1 - Last Use / Amount: UDS positive for cocaine Substance #2 Name of Substance 2: cannabis 2 - Age of First Use: UTA 2 - Amount (size/oz): UTA 2 - Frequency: UTA 2 - Duration: UTA 2  - Last Use / Amount: UDS positive for cannabis  CIWA: CIWA-Ar BP: (!) 107/57 Pulse Rate: 71 COWS:    PATIENT STRENGTHS: (choose at least two) Average or above average intelligence General fund of knowledge  Allergies:  Allergies  Allergen Reactions  . Penicillins Anaphylaxis, Swelling, Rash and Other (See Comments)    Childhood reaction Has patient had a PCN reaction causing immediate rash, facial/tongue/throat swelling, SOB or lightheadedness with hypotension: yes Has patient had a PCN reaction causing severe rash involving mucus membranes or skin necrosis: no Has patient had a PCN reaction that required hospitalization no- childhood reaction Has patient had a PCN reaction occurring within the last 10 years: no If all of the above answers are "NO", then may proceed with Cephalosporin use.     Home Medications:  (Not in a hospital admission)  OB/GYN Status:  No LMP for male patient.  General Assessment Data [facility] TTS Assessment: In system Is this a Tele or Face-to-Face Assessment?: Tele Assessment Is this an Initial Assessment or a Re-assessment for this encounter?: Initial Assessment Marital status: Single Is patient pregnant?: No Pregnancy Status: No Living Arrangements: Non-relatives/Friends Can pt return to current living arrangement?: Yes Admission Status: Voluntary Is patient capable of signing voluntary admission?: Yes Referral Source: Self/Family/Friend [facility]  Medical Screening Exam [facility]) Medical Exam completed: Yes  Crisis Care Plan Living Arrangements: Non-relatives/Friends Name of Psychiatrist: n/a Name of Therapist: n/a  Education Status Is patient currently in school?: No Highest grade of school patient has  completed: [employer]   Risk to self with the past 6 months Suicidal Ideation: No Has patient been a risk to self within the past 6 months prior to admission? : No Suicidal Intent:  No Has patient had any suicidal intent within the past 6 months prior to admission? : No Is patient at risk for suicide?: No Suicidal Plan?: No Has patient had any suicidal plan within the past 6 months prior to admission? : No Access to Means: No What has been your use of drugs/alcohol within the last 12 months?: + for cocaine and cannabis Previous Attempts/Gestures: No How many times?: 0 Intentional Self Injurious Behavior: None Family Suicide History: No Recent stressful life event(s): Loss (Comment) (grandmother died recently) Persecutory voices/beliefs?: No Depression: Yes Depression Symptoms: Insomnia, Isolating, Loss of interest in usual pleasures Substance abuse history and/or treatment for substance abuse?: Yes Suicide prevention information given to non-admitted patients: Not applicable  Risk to Others within the past 6 months Homicidal Ideation: No Does patient have any lifetime risk of violence toward others beyond the six months prior to admission? : No Thoughts of Harm to Others: No Current Homicidal Intent: No Current Homicidal Plan: No Access to Homicidal Means: No History of harm to others?: No Assessment of Violence: None Noted Does patient have access to weapons?: No Criminal Charges Pending?: No (DUI hx) Does patient have a court date: No Is patient on probation?: No  Psychosis Hallucinations: None noted Delusions: None noted  Mental Status Report Appearance/Hygiene: Unremarkable Eye Contact: Fair Motor Activity: Freedom of movement Speech: Logical/coherent Level of Consciousness: Alert Mood: Depressed Affect: Appropriate to circumstance Anxiety Level: None Thought Processes: Coherent Judgement: Unimpaired Orientation: Person, Place, Time, Situation Obsessive Compulsive Thoughts/Behaviors: None  Cognitive Functioning Concentration: Normal Memory: Recent Intact, Remote Intact IQ: Average Insight: Fair Impulse Control: Fair Appetite:  Poor Weight Loss: 0 Weight Gain: 0 Sleep: Decreased Total Hours of Sleep: 2 Vegetative Symptoms:  (in his room for 2 days)  ADLScreening [facility] Assessment Services) Patient's cognitive ability adequate to safely complete daily activities?: Yes Patient able to express need for assistance with ADLs?: Yes Independently performs ADLs?: Yes (appropriate for developmental age)  Prior Inpatient Therapy Prior Inpatient Therapy: No  Prior Outpatient Therapy Prior Outpatient Therapy: No Does patient have an ACCT team?: No Does patient have Intensive In-House Services?  : No Does patient have [facility] services? : No Does patient have P4CC services?: No  ADL Screening (condition at time of admission) Patient's cognitive ability adequate to safely complete daily activities?: Yes Is the patient deaf or have difficulty hearing?: No Does the patient have difficulty seeing, even when wearing glasses/contacts?: No Does the patient have difficulty concentrating, remembering, or making decisions?: No Patient able to express need for assistance with ADLs?: Yes Does the patient have difficulty dressing or bathing?: No Independently performs ADLs?: Yes (appropriate for developmental age)       Abuse/Neglect Assessment (Assessment to be complete while patient is alone) Physical Abuse:  (UTA) Verbal Abuse:  (UTA) Sexual Abuse:  (UTA)     Advance Directives (For Healthcare) Does Patient Have a Medical Advance Directive?: No    Additional Information 1:1 In Past 12 Months?: No CIRT Risk: No Elopement Risk: No Does patient have medical clearance?: Yes     Disposition:  Disposition Initial Assessment Completed for this Encounter: Yes Disposition of Patient: Outpatient treatment Type of outpatient treatment: Adult     Vonzell Schlatter [facility] 06/18/2017 8:06 PM

## 2017-06-18 NOTE — ED Provider Notes (Signed)
31 year old male presenting with depression x1 month and increased alcohol intake signed out to me by PA lawyer pending TTS consult. Per PA Lawyer's HPI:   "Patient presents to the emergency department with increasing depression over the last month.  He states that he has had a lot of increasing drinking.  He states that he is having difficulty sleeping.  He states that he lost a family member about a month ago Seemed to get worse.  He states he does have a history of depression but never to this extent.  Patient states he is not suicidal or homicidal at this time. The patient denies chest pain, shortness of breath, headache,blurred vision, neck pain, fever, cough, weakness, numbness, dizziness, anorexia, edema, abdominal pain, nausea, vomiting, diarrhea, rash, back pain, dysuria, hematemesis, bloody stool, near syncope, or syncope. History reviewed. No pertinent past medical history."   Physical Exam  BP 113/64 (BP Location: Left Arm)   Pulse 69   Temp 97.7 F (36.5 C) (Oral)   Resp 16   SpO2 100%   Physical Exam  No SI or HI.  ED Course  Procedures  MDM 31 year old male with history of worsening depression and increased alcohol intake over the last month that began after losing a family member. No active SI or HI. No auditory or visual hallucinations. Consulted TTS who recommended outpatient treatment. Discussed the recommendation with the patient who is agreeable with the plan at this time. Outpatient resources were provided to the patient. Strict return precautions given, including new or worsening symptoms such as SI, developing a plan, or HI. Vital signs stable. No acute distress. The patient states for discharge at this time.       Frederik PearMcDonald, Kristl Morioka A, PA-C 06/19/17 [id number]    Benjiman CorePickering, Nathan, MD 06/20/17 [phone]

## 2017-08-17 ENCOUNTER — Ambulatory Visit (HOSPITAL_COMMUNITY)
Admission: EM | Admit: 2017-08-17 | Discharge: 2017-08-17 | Disposition: A | Discharge status: Home / Self Care | Payer: 59 | Attending: Family Medicine | Admitting: Family Medicine

## 2017-08-17 ENCOUNTER — Encounter (HOSPITAL_COMMUNITY): Payer: Self-pay | Admitting: Emergency Medicine

## 2017-08-17 DIAGNOSIS — M79672 Pain in left foot: Secondary | ICD-10-CM

## 2017-08-17 MED ORDER — DICLOFENAC SODIUM 75 MG PO TBEC: 75.0000 mg | DELAYED_RELEASE_TABLET | Freq: Two times a day (BID) | ORAL | 0 refills | Status: DC

## 2017-08-17 NOTE — ED Triage Notes (Signed)
Pt c/o L foot pain, thinks he stepps on something, tender to palpation.

## 2017-08-18 NOTE — ED Provider Notes (Signed)
  [facility]   [id number] 08/17/17 Arrival Time: 1907  ASSESSMENT & PLAN:  1. Foot pain, left     Meds ordered this encounter  Medications  . diclofenac (VOLTAREN) 75 MG EC tablet    Sig: Take 1 tablet (75 mg total) by mouth 2 (two) times daily.    Dispense:  14 tablet    Refill:  0   Work note given with temporary restrictions. NSAID. Will f/u if not showing improvement over the next few days. Reviewed expectations re: course of current medical issues. Questions answered. Outlined signs and symptoms indicating need for more acute intervention. Patient verbalized understanding. After Visit Summary given.   SUBJECTIVE:  Dwayne Miller is a 31 y.o. male who reports pain of his plantar left foot just behind great and 2nd toes. Gradual onset over the past week. No specific injury/trauma. Stands and walks all day at work. No h/o similar. Better in the morning and worse after prolonged walking/standing. No extremity sensation changes or weakness. No OTC treatment. No swelling/edema.   ROS: As per HPI.   OBJECTIVE:  Vitals:   08/17/17 1932  BP: 128/84  Pulse: (!) 109  Resp: 18  Temp: 98.6 F (37 C)  TempSrc: Oral  SpO2: 100%    General appearance: alert; no distress Extremities: no cyanosis or edema; symmetrical with no gross deformities; tenderness over his left plantar distal metatarsal area near 1st/2nd/3rd toes with no swelling and no bruising CV: normal extremity capillary refill Skin: warm and dry Neurologic: normal gait; normal symmetric reflexes in all extremities; normal sensation Psychological: alert and cooperative; normal mood and affect  Allergies  Allergen Reactions  . Penicillins Anaphylaxis, Swelling, Rash and Other (See Comments)    Childhood reaction Has patient had a PCN reaction causing immediate rash, facial/tongue/throat swelling, SOB or lightheadedness with hypotension: yes Has patient had a PCN reaction causing severe rash  involving mucus membranes or skin necrosis: no Has patient had a PCN reaction that required hospitalization no- childhood reaction Has patient had a PCN reaction occurring within the last 10 years: no If all of the above answers are "NO", then may proceed with Cephalosporin use.      Social History   Social History  . Marital status: Single    Spouse name: N/A  . Number of children: N/A  . Years of education: N/A   Occupational History  . Not on file.   Social History Main Topics  . Smoking status: Current Every Day Smoker    Packs/day: 1.00    Types: Cigarettes  . Smokeless tobacco: Never Used  . Alcohol use 9.0 oz/week    15 Cans of beer per week  . Drug use: No  . Sexual activity: Not on file   Other Topics Concern  . Not on file   Social History Narrative  . No narrative on file     Dwayne Miller, Dwayne Binegar, MD 08/18/17 [phone]

## 2017-09-30 ENCOUNTER — Emergency Department (HOSPITAL_COMMUNITY): Payer: 59

## 2017-09-30 ENCOUNTER — Emergency Department (HOSPITAL_COMMUNITY)
Admission: EM | Admit: 2017-09-30 | Discharge: 2017-09-30 | Disposition: A | Discharge status: Home / Self Care | Payer: 59 | Attending: Emergency Medicine | Admitting: Emergency Medicine

## 2017-09-30 ENCOUNTER — Encounter (HOSPITAL_COMMUNITY): Payer: Self-pay

## 2017-09-30 ENCOUNTER — Other Ambulatory Visit: Payer: Self-pay

## 2017-09-30 DIAGNOSIS — R509 Fever, unspecified: Secondary | ICD-10-CM | POA: Diagnosis not present

## 2017-09-30 DIAGNOSIS — F17210 Nicotine dependence, cigarettes, uncomplicated: Secondary | ICD-10-CM | POA: Diagnosis not present

## 2017-09-30 DIAGNOSIS — R112 Nausea with vomiting, unspecified: Secondary | ICD-10-CM | POA: Insufficient documentation

## 2017-09-30 DIAGNOSIS — R0981 Nasal congestion: Secondary | ICD-10-CM | POA: Diagnosis not present

## 2017-09-30 DIAGNOSIS — J00 Acute nasopharyngitis [common cold]: Secondary | ICD-10-CM | POA: Diagnosis not present

## 2017-09-30 DIAGNOSIS — R197 Diarrhea, unspecified: Secondary | ICD-10-CM | POA: Insufficient documentation

## 2017-09-30 DIAGNOSIS — R05 Cough: Secondary | ICD-10-CM | POA: Diagnosis present

## 2017-09-30 DIAGNOSIS — R7989 Other specified abnormal findings of blood chemistry: Secondary | ICD-10-CM

## 2017-09-30 LAB — COMPREHENSIVE METABOLIC PANEL
ALT: 12 U/L — AB (ref 17–63)
AST: 20 U/L (ref 15–41)
Albumin: 3.8 g/dL (ref 3.5–5.0)
Alkaline Phosphatase: 84 U/L (ref 38–126)
Anion gap: 10 (ref 5–15)
BUN: 8 mg/dL (ref 6–20)
CHLORIDE: 104 mmol/L (ref 101–111)
CO2: 23 mmol/L (ref 22–32)
CREATININE: 1.39 mg/dL — AB (ref 0.61–1.24)
Calcium: 8.7 mg/dL — ABNORMAL LOW (ref 8.9–10.3)
GFR calc non Af Amer: >60 mL/min (ref >60)
Glucose, Bld: 85 mg/dL (ref 65–99)
POTASSIUM: 4.2 mmol/L (ref 3.5–5.1)
SODIUM: 137 mmol/L (ref 135–145)
Total Bilirubin: 0.7 mg/dL (ref 0.3–1.2)
Total Protein: 7.1 g/dL (ref 6.5–8.1)

## 2017-09-30 LAB — CBC WITH DIFFERENTIAL/PLATELET
BASOS ABS: 0.1 10*3/uL (ref 0.0–0.1)
Basophils Relative: 1 %
Eosinophils Absolute: 0.2 10*3/uL (ref 0.0–0.7)
Eosinophils Relative: 3 %
HEMATOCRIT: 39.6 % (ref 39.0–52.0)
Hemoglobin: 13.5 g/dL (ref 13.0–17.0)
LYMPHS PCT: 32 %
Lymphs Abs: 2.8 10*3/uL (ref 0.7–4.0)
MCH: 32.5 pg (ref 26.0–34.0)
MCHC: 34.1 g/dL (ref 30.0–36.0)
MCV: 95.4 fL (ref 78.0–100.0)
MONO ABS: 0.7 10*3/uL (ref 0.1–1.0)
MONOS PCT: 8 %
Neutro Abs: 5 10*3/uL (ref 1.7–7.7)
Neutrophils Relative %: 58 %
PLATELETS: 323 10*3/uL (ref 150–400)
RBC: 4.15 MIL/uL — ABNORMAL LOW (ref 4.22–5.81)
RDW: 13.6 % (ref 11.5–15.5)
WBC: 8.7 10*3/uL (ref 4.0–10.5)

## 2017-09-30 LAB — LIPASE, BLOOD: LIPASE: 22 U/L (ref 11–51)

## 2017-09-30 MED ORDER — CETIRIZINE HCL 10 MG PO TABS: 10.0000 mg | ORAL_TABLET | Freq: Every day | ORAL | 1 refills | Status: DC

## 2017-09-30 MED ORDER — ONDANSETRON HCL 4 MG/2ML IJ SOLN
4.0000 mg | Freq: Once | INTRAMUSCULAR | Status: AC
  Administered 2017-09-30: 4 mg via INTRAVENOUS
  Filled 2017-09-30: qty 2

## 2017-09-30 MED ORDER — FLUTICASONE PROPIONATE 50 MCG/ACT NA SUSP: 2.0000 | Freq: Every day | NASAL | 0 refills | Status: DC

## 2017-09-30 MED ORDER — SODIUM CHLORIDE 0.9 % IV BOLUS (SEPSIS)
1000.0000 mL | Freq: Once | INTRAVENOUS | Status: AC
  Administered 2017-09-30: 1000 mL via INTRAVENOUS

## 2017-09-30 MED ORDER — IPRATROPIUM-ALBUTEROL 0.5-2.5 (3) MG/3ML IN SOLN
3.0000 mL | Freq: Once | RESPIRATORY_TRACT | Status: AC
Start: 2017-09-30 — End: 2017-09-30
  Administered 2017-09-30: 3 mL via RESPIRATORY_TRACT
  Filled 2017-09-30: qty 3

## 2017-09-30 MED ORDER — ONDANSETRON 4 MG PO TBDP: 4.0000 mg | ORAL_TABLET | Freq: Three times a day (TID) | ORAL | 0 refills | Status: DC | PRN

## 2017-09-30 NOTE — ED Notes (Signed)
Pt states that his nausea and breathing feel better.

## 2017-09-30 NOTE — ED Provider Notes (Signed)
[facility]   CSN: [id number] Arrival date & time: 09/30/17  0830     History   Chief Complaint Chief Complaint  Patient presents with  . fever/congestion/vomiting    HPI Dwayne Miller is a 31 y.o. male.  Dwayne OmsKenyaida V Miller is a 31 y.o. Male who presents to the emergency department complaining of cough, congestion vomiting and diarrhea today.  Patient reports his symptoms began yesterday with a fever, slight cough, sneezing and nasal congestion.  He reports a temperature of 101 prior to arrival today.  He reports he woke up this morning and spelled with his girlfriend was cooking and then had one episode of vomiting and one episode of diarrhea.  He has had no abdominal pain. No further nausea.   He reports he checked his temperature this morning and it was 101.  He took Motrin before coming to the emergency department and kept this down.  He reports some coughing, sneezing and nasal congestion and postnasal drip.  He has had no wheezing or trouble breathing.  He is a smoker.  He denies abdominal pain, hemoptysis, hematemesis, hematochezia, urinary symptoms, rashes, neck pain, sore throat, trouble swallowing, lightheadedness or syncope.   The history is provided by the patient and medical records. No language interpreter was used.    History reviewed. No pertinent past medical history.  There are no active problems to display for this patient.   History reviewed. No pertinent surgical history.     Home Medications    Prior to Admission medications   Medication Sig Start Date End Date Taking? Authorizing Provider  cetirizine (ZYRTEC ALLERGY) 10 MG tablet Take 1 tablet (10 mg total) by mouth daily. 09/30/17   Everlene Farrieransie, Jeramie Scogin, PA-C  fluticasone (FLONASE) 50 MCG/ACT nasal spray Place 2 sprays into both nostrils daily. 09/30/17   Everlene Farrieransie, Georgeana Oertel, PA-C  ondansetron (ZOFRAN ODT) 4 MG disintegrating tablet Take 1 tablet (4  mg total) by mouth every 8 (eight) hours as needed for nausea or vomiting. 09/30/17   Everlene Farrieransie, Dalaysia Harms, PA-C    Family History No family history on file.  Social History Social History   Tobacco Use  . Smoking status: Current Every Day Smoker    Packs/day: 1.00    Types: Cigarettes  . Smokeless tobacco: Never Used  Substance Use Topics  . Alcohol use: Yes    Alcohol/week: 9.0 oz    Types: 15 Cans of beer per week  . Drug use: No     Allergies   Penicillins   Review of Systems Review of Systems  Constitutional: Positive for fever.  HENT: Positive for congestion, postnasal drip, rhinorrhea and sneezing. Negative for sore throat.   Eyes: Negative for visual disturbance.  Respiratory: Positive for cough. Negative for shortness of breath and wheezing.   Cardiovascular: Negative for chest pain and palpitations.  Gastrointestinal: Positive for diarrhea, nausea and vomiting. Negative for abdominal pain.  Genitourinary: Negative for dysuria and frequency.  Musculoskeletal: Negative for back pain and neck pain.  Skin: Negative for rash.  Neurological: Negative for syncope, light-headedness and headaches.     Physical Exam Updated Vital Signs BP (!) 113/59   Pulse 82   Temp 98.1 F (36.7 C) (Oral)   Resp 18   SpO2 97%   Physical Exam  Constitutional: He appears well-developed and well-nourished. No distress.  Nontoxic appearing.  HENT:  Head: Normocephalic and atraumatic.  Mouth/Throat: Oropharynx is clear and moist. No oropharyngeal exudate.  Rhinorrhea present.  Throat is clear.  Eyes: Conjunctivae are normal. Pupils are equal, round, and reactive to light. Right eye exhibits no discharge. Left eye exhibits no discharge.  Neck: Neck supple.  Cardiovascular: Normal rate, regular rhythm, normal heart sounds and intact distal pulses. Exam reveals no gallop and no friction rub.  No murmur heard. Pulmonary/Chest: Effort normal and breath sounds normal. No stridor. No  respiratory distress. He has no wheezes. He has no rales.  Slight rhonchi noted to right base.  Lungs otherwise clear.  No increased work of breathing.  Abdominal: Soft. Bowel sounds are normal. He exhibits no distension and no mass. There is no tenderness. There is no rebound and no guarding.  Abdomen is soft and nontender to palpation.  Bowel sounds are present.  Musculoskeletal: He exhibits no edema.  Lymphadenopathy:    He has no cervical adenopathy.  Neurological: He is alert. Coordination normal.  Skin: Skin is warm and dry. No rash noted. He is not diaphoretic. No erythema. No pallor.  Psychiatric: He has a normal mood and affect. His behavior is normal.  Nursing note and vitals reviewed.    ED Treatments / Results  Labs (all labs ordered are listed, but only abnormal results are displayed) Labs Reviewed  COMPREHENSIVE METABOLIC PANEL - Abnormal; Notable for the following components:      Result Value   Creatinine, Ser 1.39 (*)    Calcium 8.7 (*)    ALT 12 (*)    All other components within normal limits  CBC WITH DIFFERENTIAL/PLATELET - Abnormal; Notable for the following components:   RBC 4.15 (*)    All other components within normal limits  LIPASE, BLOOD    EKG  EKG Interpretation None       Radiology Dg Chest 2 View  Result Date: 09/30/2017 CLINICAL DATA:  Cough, fever EXAM: CHEST  2 VIEW COMPARISON:  06/03/2011 FINDINGS: Heart and mediastinal contours are within normal limits. No focal opacities or effusions. No acute bony abnormality. IMPRESSION: No active cardiopulmonary disease. Electronically Signed   By: Charlett Nose M.D.   On: 09/30/2017 09:40    Procedures Procedures (including critical care time)  Medications Ordered in ED Medications  sodium chloride 0.9 % bolus 1,000 mL (1,000 mLs Intravenous New Bag/Given 09/30/17 0957)  ondansetron (ZOFRAN) injection 4 mg (4 mg Intravenous Given 09/30/17 0956)  ipratropium-albuterol (DUONEB) 0.5-2.5 (3)  MG/3ML nebulizer solution 3 mL (3 mLs Nebulization Given 09/30/17 0957)     Initial Impression / Assessment and Plan / ED Course  I have reviewed the triage vital signs and the nursing notes.  Pertinent labs & imaging results that were available during my care of the patient were reviewed by me and considered in my medical decision making (see chart for details).     The patient was counseled on the dangers of tobacco use, and was advised to quit.  Reviewed strategies to maximize success, including removing cigarettes and smoking materials from environment.   This  is a 31 y.o. Male who presents to the emergency department complaining of cough, congestion vomiting and diarrhea today.  Patient reports his symptoms began yesterday with a fever, slight cough, sneezing and nasal congestion.  He reports a temperature of 101 prior to arrival today.  He reports he woke up this morning and spelled with his girlfriend was cooking and then had one episode of vomiting and one episode of diarrhea.  He has had no abdominal pain. No further nausea.   He reports he checked his  temperature this morning and it was 101.  He took Motrin before coming to the emergency department and kept this down.  He reports some coughing, sneezing and nasal congestion and postnasal drip.  He has had no wheezing or trouble breathing.  He is a smoker.  On exam the patient is afebrile nontoxic-appearing.  He has slight rhonchi to his right base, otherwise lungs are clear.  No increased work of breathing.  Abdomen is soft and nontender to palpation. Chest x-ray is unremarkable.  Lipase is within normal limits.  CBC is unremarkable.  CMP is remarkable only for creatinine of 1.39.  This is a slight increase from his baseline.  May be due to the episode of vomiting and diarrhea.  He received a liter fluid bolus.  He is tolerating p.o. at reevaluation.  He is eating and drinking and reports he feels much better at reevaluation.  He feels  ready for discharge.  Will discharge with prescription for Zofran, Flonase and Zyrtec.  Return precautions discussed.  I advised he needs to have his kidney function rechecked by primary care in about a week. I advised the patient to follow-up with their primary care provider this week. I advised the patient to return to the emergency department with new or worsening symptoms or new concerns. The patient verbalized understanding and agreement with plan.    Final Clinical Impressions(s) / ED Diagnoses   Final diagnoses:  Acute nasopharyngitis  Non-intractable vomiting with nausea, unspecified vomiting type  Elevated serum creatinine    ED Discharge Orders        Ordered    ondansetron (ZOFRAN ODT) 4 MG disintegrating tablet  Every 8 hours PRN     09/30/17 1125    fluticasone (FLONASE) 50 MCG/ACT nasal spray  Daily     09/30/17 1125    cetirizine (ZYRTEC ALLERGY) 10 MG tablet  Daily     09/30/17 1125       Everlene FarrierDansie, Whitaker Holderman, PA-C 09/30/17 1146    Benjiman CorePickering, Nathan, MD 09/30/17 [phone]

## 2017-09-30 NOTE — Discharge Instructions (Signed)
Please have your kidney function rechecked by your doctor. Creatinine was 1.39 today.

## 2017-09-30 NOTE — ED Triage Notes (Signed)
Patient complains of day 2 of fever, congestion and intermittent vomiting. Denies pain, NAD

## 2017-09-30 NOTE — ED Notes (Signed)
Will PA at bedside.  

## 2017-09-30 NOTE — ED Notes (Signed)
Patient transported to X-ray 

## 2018-04-11 ENCOUNTER — Encounter (HOSPITAL_COMMUNITY): Payer: Self-pay | Admitting: Emergency Medicine

## 2018-04-11 ENCOUNTER — Ambulatory Visit (HOSPITAL_COMMUNITY)
Admission: EM | Admit: 2018-04-11 | Discharge: 2018-04-11 | Disposition: A | Discharge status: Home / Self Care | Payer: 59 | Attending: Family Medicine | Admitting: Family Medicine

## 2018-04-11 DIAGNOSIS — R05 Cough: Secondary | ICD-10-CM

## 2018-04-11 DIAGNOSIS — J069 Acute upper respiratory infection, unspecified: Secondary | ICD-10-CM

## 2018-04-11 DIAGNOSIS — R0981 Nasal congestion: Secondary | ICD-10-CM | POA: Diagnosis not present

## 2018-04-11 DIAGNOSIS — R059 Cough, unspecified: Secondary | ICD-10-CM

## 2018-04-11 MED ORDER — HYDROCODONE-HOMATROPINE 5-1.5 MG/5ML PO SYRP: 5.0000 mL | ORAL_SOLUTION | Freq: Four times a day (QID) | ORAL | 0 refills | Status: DC | PRN

## 2018-04-11 MED ORDER — FLUTICASONE PROPIONATE 50 MCG/ACT NA SUSP: 2.0000 | Freq: Every day | NASAL | 0 refills | Status: DC

## 2018-04-11 MED ORDER — ALBUTEROL SULFATE HFA 108 (90 BASE) MCG/ACT IN AERS: 2.0000 | INHALATION_SPRAY | RESPIRATORY_TRACT | 1 refills | Status: DC | PRN

## 2018-04-11 MED ORDER — IPRATROPIUM BROMIDE 0.03 % NA SOLN: 2.0000 | Freq: Two times a day (BID) | NASAL | 0 refills | Status: DC

## 2018-04-11 NOTE — ED Triage Notes (Signed)
Pt sts congestion and URI sx

## 2018-04-11 NOTE — ED Provider Notes (Signed)
[facility]   [id number] 04/11/18 Arrival Time: 1609   SUBJECTIVE:  Dwayne Miller is a 32 y.o. male who presents to the urgent care with complaint of congestion and URI sx for the last day with rhinorrhea, chest tightness, nasal congestion and cough.  H/O asthma, none in years  Smoker  Works at [employer]     History reviewed. No pertinent past medical history. History reviewed. No pertinent family history. Social History   Socioeconomic History  . Marital status: Single    Spouse name: Not on file  . Number of children: Not on file  . Years of education: Not on file  . Highest education level: Not on file  Occupational History  . Not on file  Social Needs  . Financial resource strain: Not on file  . Food insecurity:    Worry: Not on file    Inability: Not on file  . Transportation needs:    Medical: Not on file    Non-medical: Not on file  Tobacco Use  . Smoking status: Current Every Day Smoker    Packs/day: 1.00    Types: Cigarettes  . Smokeless tobacco: Never Used  Substance and Sexual Activity  . Alcohol use: Yes    Alcohol/week: 9.0 oz    Types: 15 Cans of beer per week  . Drug use: No  . Sexual activity: Not on file  Lifestyle  . Physical activity:    Days per week: Not on file    Minutes per session: Not on file  . Stress: Not on file  Relationships  . Social connections:    Talks on phone: Not on file    Gets together: Not on file    Attends religious service: Not on file    Active member of club or organization: Not on file    Attends meetings of clubs or organizations: Not on file    Relationship status: Not on file  . Intimate partner violence:    Fear of current or ex partner: Not on file    Emotionally abused: Not on file    Physically abused: Not on file    Forced sexual activity: Not on file  Other Topics Concern  . Not on file  Social History Narrative  . Not on file   No outpatient medications have been marked  as taking for the 04/11/18 encounter [facility] Encounter).   Allergies  Allergen Reactions  . Penicillins Anaphylaxis, Swelling, Rash and Other (See Comments)    Childhood reaction Has patient had a PCN reaction causing immediate rash, facial/tongue/throat swelling, SOB or lightheadedness with hypotension: yes Has patient had a PCN reaction causing severe rash involving mucus membranes or skin necrosis: no Has patient had a PCN reaction that required hospitalization no- childhood reaction Has patient had a PCN reaction occurring within the last 10 years: no If all of the above answers are "NO", then may proceed with Cephalosporin use.       ROS: As per HPI, remainder of ROS negative.   OBJECTIVE:   Vitals:   04/11/18 1631  BP: 127/87  Pulse: 85  Resp: 18  Temp: 98.5 F (36.9 C)  TempSrc: Oral  SpO2: 99%     General appearance: alert; no distress Eyes: PERRL; EOMI; conjunctiva normal HENT: normocephalic; atraumatic; TMs normal, canal normal, external ears normal without trauma; nasal mucosa swollen; oral mucosa normal Neck: supple Lungs: faint wheezes on auscultation bilaterally Heart: regular rate and rhythm Back: no CVA tenderness Extremities: no  cyanosis or edema; symmetrical with no gross deformities Skin: warm and dry Neurologic: normal gait; grossly normal Psychological: alert and cooperative; normal mood and affect      Labs:  Results for orders placed or performed during the hospital encounter of 09/30/17  Comprehensive metabolic panel  Result Value Ref Range   Sodium 137 135 - 145 mmol/L   Potassium 4.2 3.5 - 5.1 mmol/L   Chloride 104 101 - 111 mmol/L   CO2 23 22 - 32 mmol/L   Glucose, Bld 85 65 - 99 mg/dL   BUN 8 6 - 20 mg/dL   Creatinine, Ser 9.601.39 (H) 0.61 - 1.24 mg/dL   Calcium 8.7 (L) 8.9 - 10.3 mg/dL   Total Protein 7.1 6.5 - 8.1 g/dL   Albumin 3.8 3.5 - 5.0 g/dL   AST 20 15 - 41 U/L   ALT 12 (L) 17 - 63 U/L   Alkaline Phosphatase 84 38 -  126 U/L   Total Bilirubin 0.7 0.3 - 1.2 mg/dL   GFR calc non Af Amer >60 >60 mL/min   GFR calc Af Amer >60 >60 mL/min   Anion gap 10 5 - 15  Lipase, blood  Result Value Ref Range   Lipase 22 11 - 51 U/L  CBC with Differential  Result Value Ref Range   WBC 8.7 4.0 - 10.5 K/uL   RBC 4.15 (L) 4.22 - 5.81 MIL/uL   Hemoglobin 13.5 13.0 - 17.0 g/dL   HCT 45.439.6 09.839.0 - 11.952.0 %   MCV 95.4 78.0 - 100.0 fL   MCH 32.5 26.0 - 34.0 pg   MCHC 34.1 30.0 - 36.0 g/dL   RDW 14.713.6 82.911.5 - 56.215.5 %   Platelets 323 150 - 400 K/uL   Neutrophils Relative % 58 %   Neutro Abs 5.0 1.7 - 7.7 K/uL   Lymphocytes Relative 32 %   Lymphs Abs 2.8 0.7 - 4.0 K/uL   Monocytes Relative 8 %   Monocytes Absolute 0.7 0.1 - 1.0 K/uL   Eosinophils Relative 3 %   Eosinophils Absolute 0.2 0.0 - 0.7 K/uL   Basophils Relative 1 %   Basophils Absolute 0.1 0.0 - 0.1 K/uL    Labs Reviewed - No data to display  No results found.     ASSESSMENT & PLAN:  1. Upper respiratory tract infection, unspecified type   2. Nasal congestion   3. Cough     Meds ordered this encounter  Medications  . fluticasone (FLONASE) 50 MCG/ACT nasal spray    Sig: Place 2 sprays into both nostrils daily.    Dispense:  16 g    Refill:  0  . ipratropium (ATROVENT) 0.03 % nasal spray    Sig: Place 2 sprays into both nostrils 2 (two) times daily.    Dispense:  30 mL    Refill:  0  . HYDROcodone-homatropine (HYDROMET) 5-1.5 MG/5ML syrup    Sig: Take 5 mLs by mouth every 6 (six) hours as needed for cough.    Dispense:  60 mL    Refill:  0  . albuterol (PROVENTIL HFA;VENTOLIN HFA) 108 (90 Base) MCG/ACT inhaler    Sig: Inhale 2 puffs into the lungs every 4 (four) hours as needed for wheezing or shortness of breath (cough, shortness of breath or wheezing.).    Dispense:  1 Inhaler    Refill:  1    Reviewed expectations re: course of current medical issues. Questions answered. Outlined signs and symptoms indicating need for more  acute  intervention. Patient verbalized understanding. After Visit Summary given.    Procedures:      Elvina Sidle, MD 04/11/18 [phone]

## 2018-06-30 ENCOUNTER — Encounter (HOSPITAL_COMMUNITY): Payer: Self-pay | Admitting: Emergency Medicine

## 2018-06-30 ENCOUNTER — Ambulatory Visit (HOSPITAL_COMMUNITY)
Admission: EM | Admit: 2018-06-30 | Discharge: 2018-06-30 | Disposition: A | Discharge status: Home / Self Care | Payer: 59 | Attending: Family Medicine | Admitting: Family Medicine

## 2018-06-30 DIAGNOSIS — S39012A Strain of muscle, fascia and tendon of lower back, initial encounter: Secondary | ICD-10-CM

## 2018-06-30 MED ORDER — CYCLOBENZAPRINE HCL 5 MG PO TABS: 5.0000 mg | ORAL_TABLET | Freq: Every day | ORAL | 0 refills | Status: DC

## 2018-06-30 MED ORDER — MELOXICAM 15 MG PO TABS: 15.0000 mg | ORAL_TABLET | Freq: Every day | ORAL | 0 refills | Status: DC

## 2018-06-30 NOTE — Discharge Instructions (Signed)
Activity as tolerated.  Muscle relaxer at night. May cause drowsiness. Please do not take if driving or drinking alcohol.   Meloxicam daily to help with pain. Take with food. Don't take additional ibuprofen or aleve.  See exercises provided.  If persistent symptoms or no improvement please return or follow up with your primary care provider.

## 2018-06-30 NOTE — ED Provider Notes (Signed)
[facility]    CSN: [id number] Arrival date & time: 06/30/18  1033     History   Chief Complaint Chief Complaint  Patient presents with  . Back Pain    HPI Dwayne Miller is a 32 y.o. male.   Dwayne Miller presents with low back pain which comes in spasms, started yesterday after mowing his lawn. States went to work and back had spasm so was sent home. A tight sensation, worse on right low back. Does not radiate to buttocks or thighs. No numbness, tingling or weakness. Has had intermittently in the past. Pain 8/10. Has not taken any medications for pain today. Yesterday took tylenol pm which helped. No urinary or stool incontinence, no saddle paresthesia. 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 Without contributing medical history.      ROS per HPI.      History reviewed. No pertinent past medical history.  There are no active problems to display for this patient.   History reviewed. No pertinent surgical history.     Home Medications    Prior to Admission medications   Medication Sig Start Date End Date Taking? Authorizing Provider  albuterol (PROVENTIL HFA;VENTOLIN HFA) 108 (90 Base) MCG/ACT inhaler Inhale 2 puffs into the lungs every 4 (four) hours as needed for wheezing or shortness of breath (cough, shortness of breath or wheezing.). 04/11/18   Elvina SidleLauenstein, Kurt, MD  cyclobenzaprine (FLEXERIL) 5 MG tablet Take 1 tablet (5 mg total) by mouth at bedtime. 06/30/18   Georgetta HaberBurky, Natalie B, NP  meloxicam (MOBIC) 15 MG tablet Take 1 tablet (15 mg total) by mouth daily. 06/30/18   Georgetta HaberBurky, Natalie B, NP    Family History History reviewed. No pertinent family history.  Social History Social History   Tobacco Use  . Smoking status: Current Every Day Smoker    Packs/day: 1.00    Types: Cigarettes  . Smokeless tobacco: Never Used  Substance Use Topics  . Alcohol use: Yes    Alcohol/week: 15.0 standard drinks    Types: 15 Cans of  beer per week  . Drug use: No     Allergies   Penicillins   Review of Systems Review of Systems   Physical Exam Triage Vital Signs ED Triage Vitals [06/30/18 1119]  Enc Vitals Group     BP 114/71     Pulse Rate 98     Resp 18     Temp 98.2 F (36.8 C)     Temp Source Oral     SpO2 100 %     Weight      Height      Head Circumference      Peak Flow      Pain Score      Pain Loc      Pain Edu?      Excl. in GC?    No data found.  Updated Vital Signs BP 114/71 (BP Location: Left Arm)   Pulse 98   Temp 98.2 F (36.8 C) (Oral)   Resp 18   SpO2 100%    Physical Exam  Constitutional: He is oriented to person, place, and time. He appears well-developed and well-nourished.  Cardiovascular: Normal rate and regular rhythm.  Pulmonary/Chest: Effort normal and breath sounds normal.  Musculoskeletal:       Lumbar back: He exhibits tenderness, pain and spasm. He exhibits normal range of motion, no bony tenderness, no swelling, no edema, no deformity, no laceration  and normal pulse.       Back:  Right low back musculature with tenderness; no spinous process tenderness; no step off or deformity; ambulatory with full rom, full rom to back without difficulty; mild right low back pain with right hip flexion, no pain with straight leg raise; strength equal bilaterally; gross sensation intact   Neurological: He is alert and oriented to person, place, and time.  Skin: Skin is warm and dry.     UC Treatments / Results  Labs (all labs ordered are listed, but only abnormal results are displayed) Labs Reviewed - No data to display  EKG None  Radiology No results found.  Procedures Procedures (including critical care time)  Medications Ordered in UC Medications - No data to display  Initial Impression / Assessment and Plan / UC Course  I have reviewed the triage vital signs and the nursing notes.  Pertinent labs & imaging results that were available during my care of  the patient were reviewed by me and considered in my medical decision making (see chart for details).     History and physical consistent with muscular strain and spasms. Night time use of muscle relaxers, as well as daily meloxicam provided. Encouraged follow up with pcp as needed. Patient verbalized understanding and agreeable to plan.    Final Clinical Impressions(s) / UC Diagnoses   Final diagnoses:  Strain of lumbar region, initial encounter     Discharge Instructions     Activity as tolerated.  Muscle relaxer at night. May cause drowsiness. Please do not take if driving or drinking alcohol.   Meloxicam daily to help with pain. Take with food. Don't take additional ibuprofen or aleve.  See exercises provided.  If persistent symptoms or no improvement please return or follow up with your primary care provider.    ED Prescriptions    Medication Sig Dispense Auth. Provider   cyclobenzaprine (FLEXERIL) 5 MG tablet Take 1 tablet (5 mg total) by mouth at bedtime. 15 tablet Linus Mako B, NP   meloxicam (MOBIC) 15 MG tablet Take 1 tablet (15 mg total) by mouth daily. 20 tablet Georgetta Haber, NP     Controlled Substance Prescriptions [place] Controlled Substance Registry consulted? Not Applicable   Georgetta Haber, NP 06/30/18 1147

## 2018-06-30 NOTE — ED Triage Notes (Signed)
Pt sts lower back pain x 2 days worse with exertion

## 2018-10-18 ENCOUNTER — Encounter (HOSPITAL_COMMUNITY): Payer: Self-pay | Admitting: Emergency Medicine

## 2018-10-18 ENCOUNTER — Other Ambulatory Visit: Payer: Self-pay

## 2018-10-18 ENCOUNTER — Emergency Department (HOSPITAL_COMMUNITY)
Admission: EM | Admit: 2018-10-18 | Discharge: 2018-10-18 | Disposition: A | Discharge status: Home / Self Care | Payer: Self-pay | Attending: Emergency Medicine | Admitting: Emergency Medicine

## 2018-10-18 DIAGNOSIS — Z5321 Procedure and treatment not carried out due to patient leaving prior to being seen by health care provider: Secondary | ICD-10-CM | POA: Insufficient documentation

## 2018-10-18 DIAGNOSIS — R369 Urethral discharge, unspecified: Secondary | ICD-10-CM | POA: Insufficient documentation

## 2018-10-18 LAB — URINALYSIS, ROUTINE W REFLEX MICROSCOPIC
BILIRUBIN URINE: NEGATIVE
GLUCOSE, UA: NEGATIVE mg/dL
Hgb urine dipstick: NEGATIVE
KETONES UR: NEGATIVE mg/dL
Leukocytes, UA: NEGATIVE
NITRITE: NEGATIVE
PH: 5 (ref 5.0–8.0)
PROTEIN: NEGATIVE mg/dL
Specific Gravity, Urine: 1.011 (ref 1.005–1.030)

## 2018-10-18 NOTE — ED Triage Notes (Signed)
Pt states he had a "one night stand" and the partner notified him she had GC and that he should be treated. Endorses penile discharge, lower abdominal pain, and dysuria. Denies fever, N/V

## 2018-10-18 NOTE — ED Notes (Signed)
Pt up to desk asking about wait times. Pt informed and verbalized understanding. Minutes later, pt seen storming out of ED saying "nevermind this is some bullshit". PT seen leaving ED and throwing away labels and blood pressure cuff.

## 2018-10-21 ENCOUNTER — Emergency Department (HOSPITAL_COMMUNITY)
Admission: EM | Admit: 2018-10-21 | Discharge: 2018-10-21 | Disposition: A | Discharge status: Home / Self Care | Payer: Self-pay | Attending: Emergency Medicine | Admitting: Emergency Medicine

## 2018-10-21 ENCOUNTER — Encounter (HOSPITAL_COMMUNITY): Payer: Self-pay | Admitting: *Deleted

## 2018-10-21 DIAGNOSIS — R300 Dysuria: Secondary | ICD-10-CM | POA: Insufficient documentation

## 2018-10-21 DIAGNOSIS — Z711 Person with feared health complaint in whom no diagnosis is made: Secondary | ICD-10-CM

## 2018-10-21 DIAGNOSIS — F17210 Nicotine dependence, cigarettes, uncomplicated: Secondary | ICD-10-CM | POA: Insufficient documentation

## 2018-10-21 DIAGNOSIS — Z202 Contact with and (suspected) exposure to infections with a predominantly sexual mode of transmission: Secondary | ICD-10-CM | POA: Insufficient documentation

## 2018-10-21 DIAGNOSIS — R369 Urethral discharge, unspecified: Secondary | ICD-10-CM | POA: Insufficient documentation

## 2018-10-21 DIAGNOSIS — Z79899 Other long term (current) drug therapy: Secondary | ICD-10-CM | POA: Insufficient documentation

## 2018-10-21 MED ORDER — CEFTRIAXONE SODIUM 250 MG IJ SOLR
250.0000 mg | Freq: Once | INTRAMUSCULAR | Status: AC
  Administered 2018-10-21: 250 mg via INTRAMUSCULAR
  Filled 2018-10-21: qty 250

## 2018-10-21 MED ORDER — AZITHROMYCIN 250 MG PO TABS
1000.0000 mg | ORAL_TABLET | Freq: Once | ORAL | Status: AC
Start: 2018-10-21 — End: 2018-10-21
  Administered 2018-10-21: 1000 mg via ORAL
  Filled 2018-10-21: qty 4

## 2018-10-21 MED ORDER — LIDOCAINE HCL (PF) 1 % IJ SOLN
INTRAMUSCULAR | Status: AC
  Administered 2018-10-21: 5 mL
  Filled 2018-10-21: qty 5

## 2018-10-21 MED ORDER — ONDANSETRON 4 MG PO TBDP
4.0000 mg | ORAL_TABLET | Freq: Once | ORAL | Status: AC
  Administered 2018-10-21: 4 mg via ORAL
  Filled 2018-10-21: qty 1

## 2018-10-21 NOTE — ED Provider Notes (Signed)
[facility] EMERGENCY DEPARTMENT Provider Note   CSN: [id number] Arrival date & time: 10/21/18  6073     History   Chief Complaint Chief Complaint  Patient presents with  . Penile Discharge    HPI Dwayne Miller is a 33 y.o. male who presents today for evaluation of penile discharge.  He reports that for approximately 4 days he has been having penile discharge and dysuria.  He reports that he had a 1 night stand and that the partner notified him that she had gonorrhea or chlamydia and that he should be treated.  Denies any fevers.  No pain with bowel movements.  No abdominal pain.  No hematuria.  He denies any testicle pain.  HPI  History reviewed. No pertinent past medical history.  There are no active problems to display for this patient.   History reviewed. No pertinent surgical history.      Home Medications    Prior to Admission medications   Medication Sig Start Date End Date Taking? Authorizing Provider  albuterol (PROVENTIL HFA;VENTOLIN HFA) 108 (90 Base) MCG/ACT inhaler Inhale 2 puffs into the lungs every 4 (four) hours as needed for wheezing or shortness of breath (cough, shortness of breath or wheezing.). 04/11/18   Elvina Sidle, MD  cyclobenzaprine (FLEXERIL) 5 MG tablet Take 1 tablet (5 mg total) by mouth at bedtime. 06/30/18   Georgetta Haber, NP  meloxicam (MOBIC) 15 MG tablet Take 1 tablet (15 mg total) by mouth daily. 06/30/18   Georgetta Haber, NP    Family History History reviewed. No pertinent family history.  Social History Social History   Tobacco Use  . Smoking status: Current Every Day Smoker    Packs/day: 1.00    Types: Cigarettes  . Smokeless tobacco: Never Used  Substance Use Topics  . Alcohol use: Yes    Alcohol/week: 15.0 standard drinks    Types: 15 Cans of beer per week  . Drug use: No     Allergies   Penicillins   Review of Systems Review of Systems  Constitutional: Negative for chills and fever.   Gastrointestinal: Negative for abdominal pain and rectal pain.  Genitourinary: Positive for discharge and dysuria. Negative for penile pain, penile swelling, scrotal swelling and testicular pain.  All other systems reviewed and are negative.    Physical Exam Updated Vital Signs BP 127/81 (BP Location: Right Arm)   Pulse 81   Temp 98.3 F (36.8 C) (Oral)   Resp 16   SpO2 100%   Physical Exam Vitals signs and nursing note reviewed. Exam conducted with a chaperone present.  Constitutional:      General: He is not in acute distress.    Appearance: He is not ill-appearing.  HENT:     Head: Normocephalic.  Cardiovascular:     Rate and Rhythm: Normal rate.  Pulmonary:     Effort: Pulmonary effort is normal. No respiratory distress.  Abdominal:     General: Abdomen is flat. There is no distension.     Tenderness: There is no abdominal tenderness.  Genitourinary:    Penis: Normal. No tenderness, discharge, swelling or lesions.      Scrotum/Testes: Normal.        Right: Mass, tenderness or swelling not present.        Left: Mass, tenderness or swelling not present.     Epididymis:     Right: Normal. Not inflamed. No tenderness.     Left: Normal. Not inflamed. No tenderness.  Comments: Exam performed with ED tech as chaperone. Skin:    General: Skin is warm and dry.  Neurological:     Mental Status: He is alert.  Psychiatric:        Mood and Affect: Mood normal.      ED Treatments / Results  Labs (all labs ordered are listed, but only abnormal results are displayed) Labs Reviewed  GC/CHLAMYDIA PROBE AMP ([place]) NOT AT [facility]    EKG None  Radiology No results found.  Procedures Procedures (including critical care time)  Medications Ordered in ED Medications  cefTRIAXone (ROCEPHIN) injection 250 mg (has no administration in time range)  azithromycin (ZITHROMAX) tablet 1,000 mg (has no administration in time range)  ondansetron (ZOFRAN-ODT)  disintegrating tablet 4 mg (has no administration in time range)     Initial Impression / Assessment and Plan / ED Course  I have reviewed the triage vital signs and the nursing notes.  Pertinent labs & imaging results that were available during my care of the patient were reviewed by me and considered in my medical decision making (see chart for details).  Clinical Course as of Oct 21 1122  Fri Oct 21, 2018  1114 Chart review and patient report that he has been given Rocephin multiple times in the past.  He denies any history of reactions to Rocephin.   [EH]    Clinical Course User Index [EH] Cristina Gong, PA-C   Patient is afebrile without abdominal tenderness, abdominal pain or painful bowel movements to indicate prostatitis.  No tenderness to palpation of the testes or epididymis to suggest orchitis or epididymitis.  STD cultures obtained for G/C.  Patient refused HIV and syphilis blood testing. Patient to be discharged with instructions to follow up with PCP. Discussed importance of using protection when sexually active. Pt understands that they have GC/Chlamydia cultures pending and that they will need to inform all sexual partners if results return positive. Patient has been treated prophylactically with azithromycin and Rocephin.  He is given a dose of Zofran ODT to help prevent vomiting after antibiotic administration.  He has a listed allergy to penicillins, chart review shows that he has been given Rocephin in the past.  He reports that he has tolerated Rocephin well without allergies or other side effects.   Final Clinical Impressions(s) / ED Diagnoses   Final diagnoses:  Penile discharge  Concern about STD in male without diagnosis    ED Discharge Orders    None       Norman Clay 10/21/18 1124    Terrilee Files, MD 10/22/18 1311

## 2018-10-21 NOTE — ED Triage Notes (Signed)
Pt in c/o penile discharge for the last few days, concerned for a STD

## 2018-10-21 NOTE — Discharge Instructions (Signed)
Today you have been treated for gonorrhea and chlamydia.  The test to determine if you have these will take a few days. They will only call you if your tests come back positive, no news is good news. In the result that your tests are positive you have already been treated.  Please do not have any sexual contact for at least 7 days.  Anyone with whom you have had sexual contact recently will need to be treated if your results are positive.

## 2018-10-21 NOTE — ED Notes (Signed)
Patient verbalized understanding of discharge instructions and denies any further needs or questions at this time. VS stable. Patient ambulatory with steady gait.  

## 2018-10-25 LAB — GC/CHLAMYDIA PROBE AMP (~~LOC~~) NOT AT ARMC
Chlamydia: NEGATIVE
Neisseria Gonorrhea: NEGATIVE

## 2019-08-24 IMAGING — CR DG CHEST 2V
2 series · 2 of 2 positions shown · non-contrast
Comparison: 06/03/2011

CLINICAL DATA: Cough, fever

EXAM:
CHEST  2 VIEW

[chest pa]
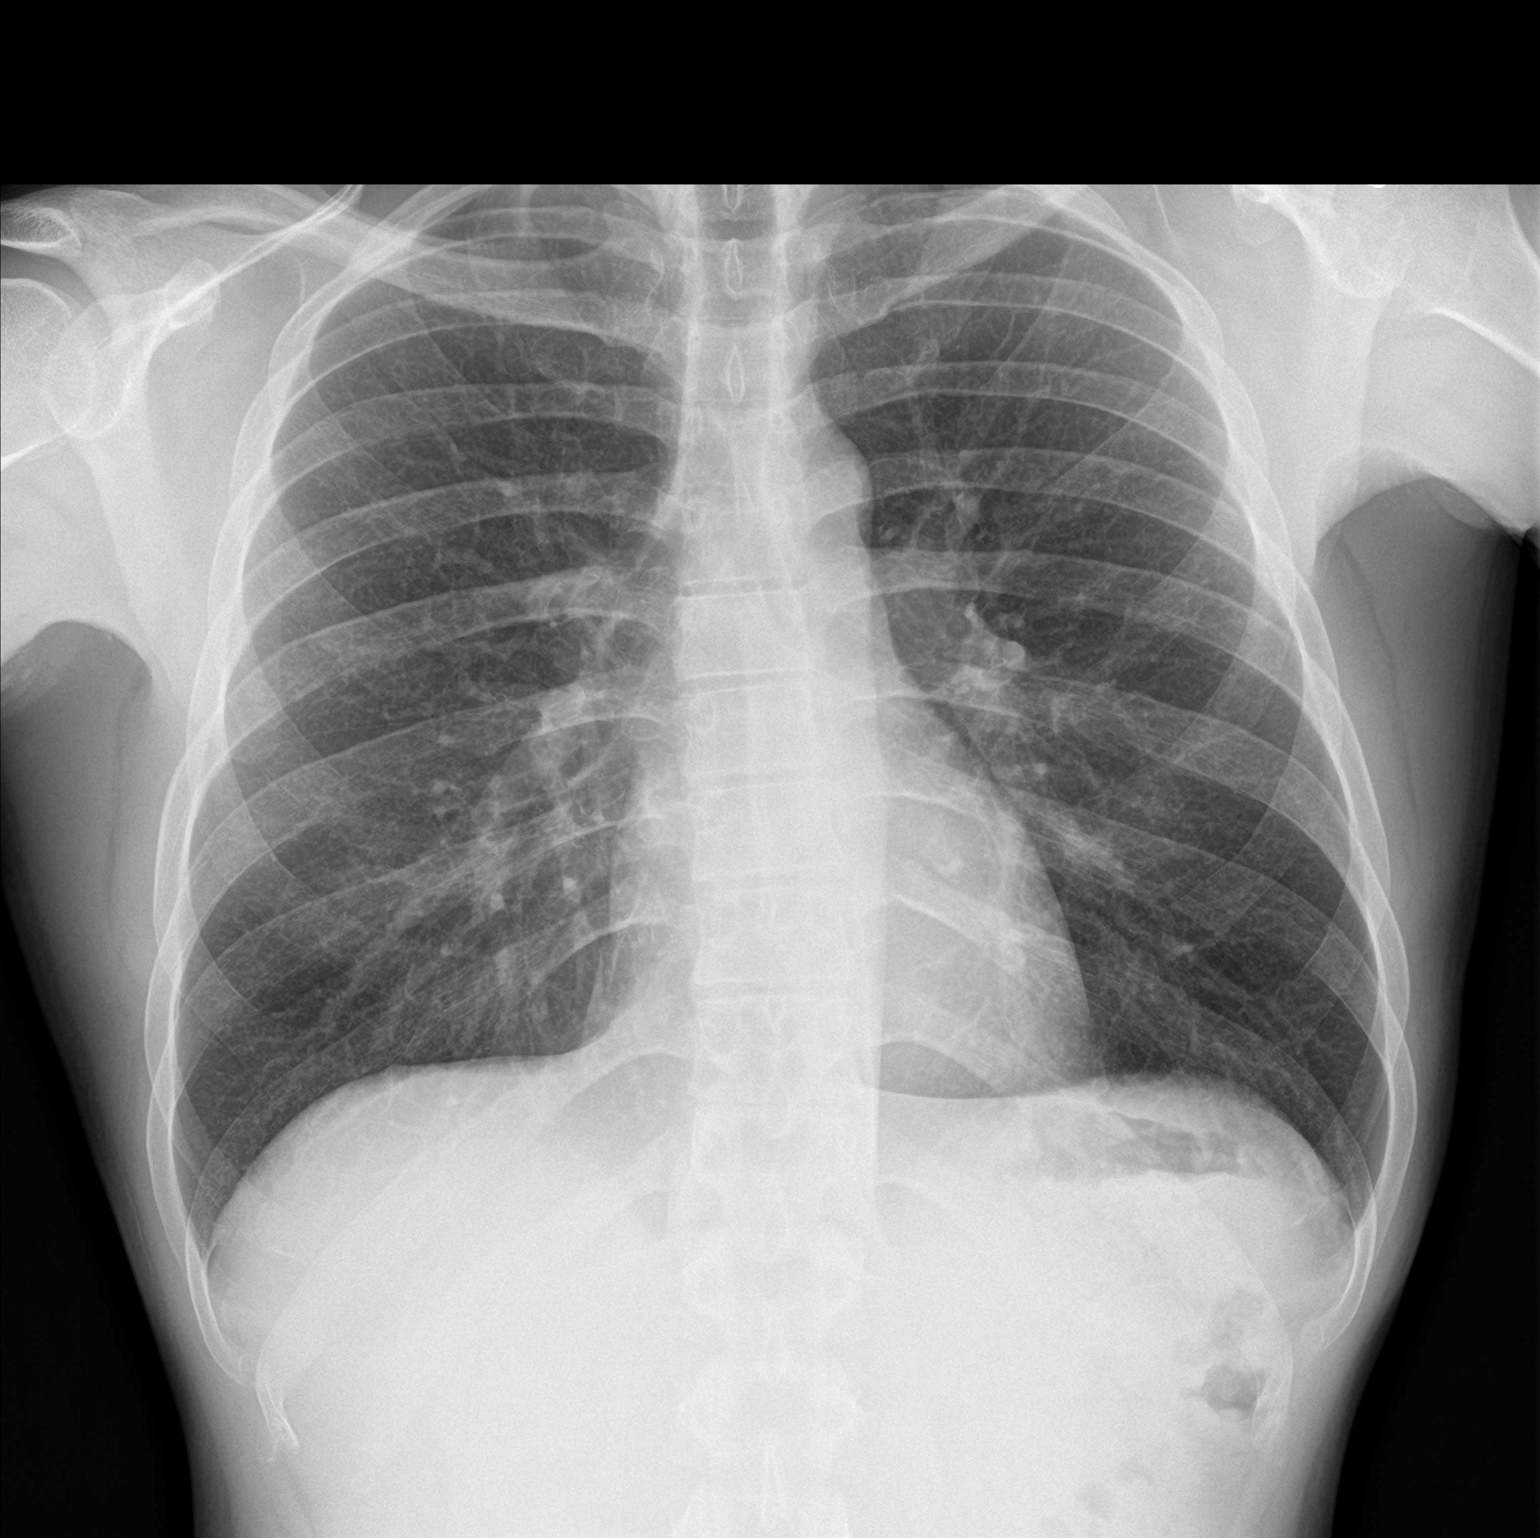

[chest lat]
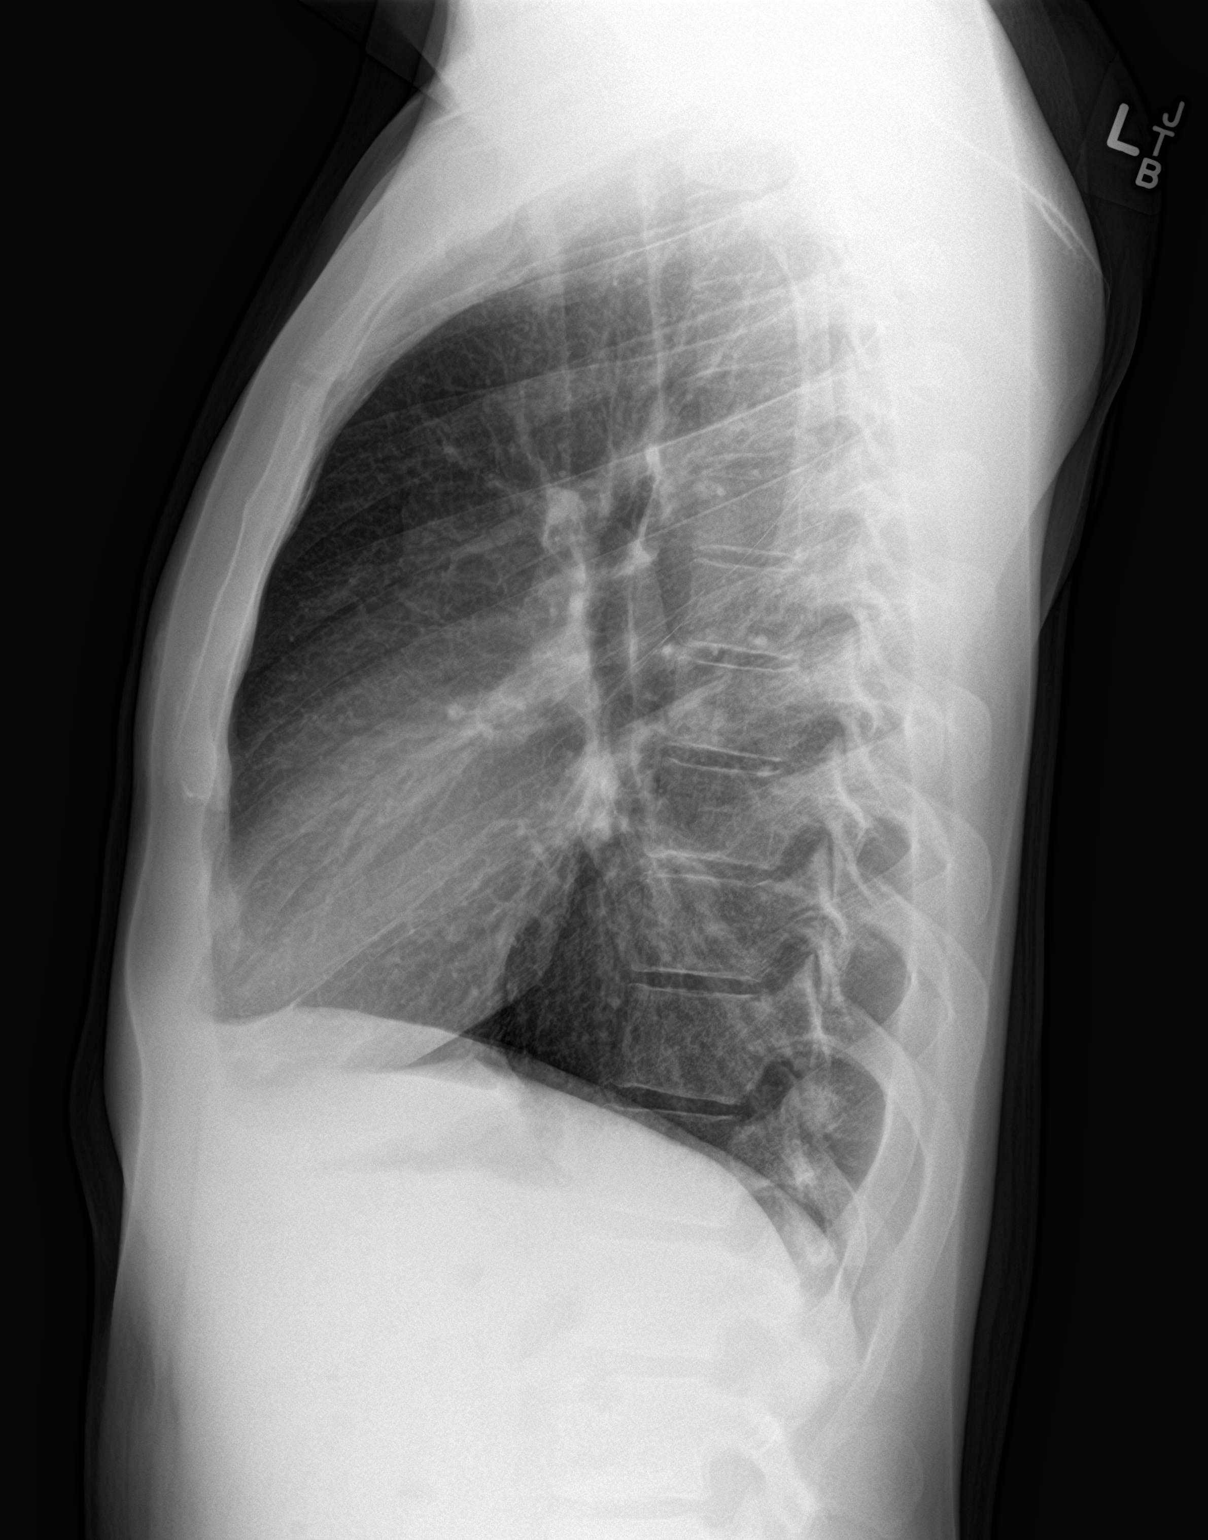

[2 of 2 positions shown; findings below may reference images not displayed]

FINDINGS: Heart and mediastinal contours are within normal limits. No focal
opacities or effusions. No acute bony abnormality.
IMPRESSION: No active cardiopulmonary disease.

## 2021-01-02 ENCOUNTER — Ambulatory Visit (HOSPITAL_COMMUNITY)
Admission: EM | Admit: 2021-01-02 | Discharge: 2021-01-02 | Disposition: A | Discharge status: Home / Self Care | Payer: Self-pay | Attending: Emergency Medicine | Admitting: Emergency Medicine

## 2021-01-02 ENCOUNTER — Encounter (HOSPITAL_COMMUNITY): Payer: Self-pay

## 2021-01-02 ENCOUNTER — Other Ambulatory Visit: Payer: Self-pay

## 2021-01-02 DIAGNOSIS — K029 Dental caries, unspecified: Secondary | ICD-10-CM

## 2021-01-02 DIAGNOSIS — K047 Periapical abscess without sinus: Secondary | ICD-10-CM

## 2021-01-02 MED ORDER — CLINDAMYCIN HCL 300 MG PO CAPS: 300.0000 mg | ORAL_CAPSULE | Freq: Three times a day (TID) | ORAL | 0 refills | Status: DC

## 2021-01-02 MED ORDER — IBUPROFEN 800 MG PO TABS: 800.0000 mg | ORAL_TABLET | Freq: Three times a day (TID) | ORAL | 0 refills | Status: DC | PRN

## 2021-01-02 NOTE — ED Triage Notes (Signed)
Pt reports dental in and right sided facial swelling x 3 days. Denies fever.

## 2021-01-02 NOTE — Discharge Instructions (Addendum)
Take the antibiotic and ibuprofen as prescribed.    A dental resource guide is attached.  Please call to make an appointment with a dentist as soon as possible.    Go to the emergency department if you have acute worsening symptoms.     

## 2021-01-02 NOTE — ED Provider Notes (Signed)
[facility]    CSN: [id number] Arrival date & time: 01/02/21  1208      History   Chief Complaint Chief Complaint  Patient presents with  . Dental Pain    HPI Dwayne Miller is a 35 y.o. male.   Patient presents with dental pain on the right upper side x3 days.  He also reports right side facial swelling.  He denies fever, chills, difficulty swallowing, or other symptoms.  He has not seen a dentist.  No treatments attempted at home.  He denies pertinent medical history.  The history is provided by the patient.    History reviewed. No pertinent past medical history.  There are no problems to display for this patient.   History reviewed. No pertinent surgical history.     Home Medications    Prior to Admission medications   Medication Sig Start Date End Date Taking? Authorizing Provider  clindamycin (CLEOCIN) 300 MG capsule Take 1 capsule (300 mg total) by mouth 3 (three) times daily. 01/02/21  Yes Mickie Bail, NP  ibuprofen (ADVIL) 800 MG tablet Take 1 tablet (800 mg total) by mouth every 8 (eight) hours as needed. 01/02/21  Yes Mickie Bail, NP  albuterol (PROVENTIL HFA;VENTOLIN HFA) 108 (90 Base) MCG/ACT inhaler Inhale 2 puffs into the lungs every 4 (four) hours as needed for wheezing or shortness of breath (cough, shortness of breath or wheezing.). 04/11/18   Elvina Sidle, MD  cyclobenzaprine (FLEXERIL) 5 MG tablet Take 1 tablet (5 mg total) by mouth at bedtime. 06/30/18   Georgetta Haber, NP  meloxicam (MOBIC) 15 MG tablet Take 1 tablet (15 mg total) by mouth daily. 06/30/18   Georgetta Haber, NP    Family History History reviewed. No pertinent family history.  Social History Social History   Tobacco Use  . Smoking status: Current Every Day Smoker    Packs/day: 1.00    Types: Cigarettes  . Smokeless tobacco: Never Used  Substance Use Topics  . Alcohol use: Yes    Alcohol/week: 15.0 standard drinks    Types: 15 Cans of beer per week  .  Drug use: No     Allergies   Penicillins   Review of Systems Review of Systems  Constitutional: Negative for chills and fever.  HENT: Positive for dental problem and facial swelling. Negative for ear pain, sore throat and trouble swallowing.   Eyes: Negative for pain and visual disturbance.  Respiratory: Negative for cough and shortness of breath.   Cardiovascular: Negative for chest pain and palpitations.  Gastrointestinal: Negative for abdominal pain and vomiting.  Genitourinary: Negative for dysuria and hematuria.  Musculoskeletal: Negative for arthralgias and back pain.  Skin: Negative for color change and rash.  Neurological: Negative for seizures and syncope.  All other systems reviewed and are negative.    Physical Exam Triage Vital Signs ED Triage Vitals  Enc Vitals Group     BP 01/02/21 1227 124/83     Pulse Rate 01/02/21 1227 78     Resp 01/02/21 1227 16     Temp 01/02/21 1227 98 F (36.7 C)     Temp Source 01/02/21 1227 Oral     SpO2 01/02/21 1227 100 %     Weight --      Height --      Head Circumference --      Peak Flow --      Pain Score 01/02/21 1225 8     Pain Loc --  Pain Edu? --      Excl. in GC? --    No data found.  Updated Vital Signs BP 124/83 (BP Location: Right Arm)   Pulse 78   Temp 98 F (36.7 C) (Oral)   Resp 16   SpO2 100%   Visual Acuity Right Eye Distance:   Left Eye Distance:   Bilateral Distance:    Right Eye Near:   Left Eye Near:    Bilateral Near:     Physical Exam Vitals and nursing note reviewed.  Constitutional:      General: He is not in acute distress.    Appearance: He is well-developed.  HENT:     Head: Normocephalic and atraumatic.     Mouth/Throat:     Mouth: Mucous membranes are moist.     Dentition: Abnormal dentition. Dental tenderness and dental caries present.      Comments: Mild right side facial edema. Eyes:     Conjunctiva/sclera: Conjunctivae normal.  Cardiovascular:     Rate and  Rhythm: Normal rate and regular rhythm.     Heart sounds: Normal heart sounds.  Pulmonary:     Effort: Pulmonary effort is normal. No respiratory distress.     Breath sounds: Normal breath sounds.  Abdominal:     Palpations: Abdomen is soft.     Tenderness: There is no abdominal tenderness.  Musculoskeletal:     Cervical back: Neck supple.  Skin:    General: Skin is warm and dry.  Neurological:     General: No focal deficit present.     Mental Status: He is alert and oriented to person, place, and time.     Gait: Gait normal.  Psychiatric:        Mood and Affect: Mood normal.        Behavior: Behavior normal.      UC Treatments / Results  Labs (all labs ordered are listed, but only abnormal results are displayed) Labs Reviewed - No data to display  EKG   Radiology No results found.  Procedures Procedures (including critical care time)  Medications Ordered in UC Medications - No data to display  Initial Impression / Assessment and Plan / UC Course  I have reviewed the triage vital signs and the nursing notes.  Pertinent labs & imaging results that were available during my care of the patient were reviewed by me and considered in my medical decision making (see chart for details).   Dental pain due to dental caries, dental infection.  Patient is allergic to penicillin.  Treating with clindamycin and ibuprofen.  Instructed patient to schedule an appointment with a dentist as soon as possible; dental resource guide provided.  Instructed him to go to the ED if he has acute worsening symptoms.  He agrees to plan of care.   Final Clinical Impressions(s) / UC Diagnoses   Final diagnoses:  Pain due to dental caries  Dental infection     Discharge Instructions     Take the antibiotic and ibuprofen as prescribed.    A dental resource guide is attached.  Please call to make an appointment with a dentist as soon as possible.    Go to the emergency department if you  have acute worsening symptoms.        ED Prescriptions    Medication Sig Dispense Auth. Provider   clindamycin (CLEOCIN) 300 MG capsule Take 1 capsule (300 mg total) by mouth 3 (three) times daily. 21 capsule Mickie Bail, NP  ibuprofen (ADVIL) 800 MG tablet Take 1 tablet (800 mg total) by mouth every 8 (eight) hours as needed. 21 tablet Mickie Bail, NP     I have reviewed the PDMP during this encounter.   Mickie Bail, NP 01/02/21 1321

## 2021-04-07 ENCOUNTER — Other Ambulatory Visit: Payer: Self-pay

## 2021-04-07 ENCOUNTER — Encounter (HOSPITAL_COMMUNITY): Payer: Self-pay

## 2021-04-07 ENCOUNTER — Ambulatory Visit (HOSPITAL_COMMUNITY)
Admission: EM | Admit: 2021-04-07 | Discharge: 2021-04-07 | Disposition: A | Discharge status: Home / Self Care | Payer: Medicaid Other | Attending: Emergency Medicine | Admitting: Emergency Medicine

## 2021-04-07 DIAGNOSIS — R1011 Right upper quadrant pain: Secondary | ICD-10-CM

## 2021-04-07 MED ORDER — DICYCLOMINE HCL 20 MG PO TABS: 20.0000 mg | ORAL_TABLET | Freq: Two times a day (BID) | ORAL | 0 refills | Status: DC

## 2021-04-07 NOTE — ED Provider Notes (Signed)
[facility]    CSN: [id number] Arrival date & time: 04/07/21  0806      History   Chief Complaint Chief Complaint  Patient presents with   Abdominal Pain    HPI Dwayne Miller is a 35 y.o. male.   Patient presents with RUQ abdominal pain present  for weeks. Described as intermittent and sharp, interfering with sleep. Increased gas production and bloating. 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 No worsened by food or liquids.  Hematuria, blood in stool, fever, chills, dietary changes, recent travel. Being worked up by primary care. Last appointment 3 days ago. Per patient all lab work negative, u/s of liver negative. Endoscopy planned for early August. Being overusing ibuprofen for pain control , taken as many as 6 pills at a time.   History reviewed. No pertinent past medical history.  There are no problems to display for this patient.   History reviewed. No pertinent surgical history.     Home Medications    Prior to Admission medications   Medication Sig Start Date End Date Taking? Authorizing Provider  dicyclomine (BENTYL) 20 MG tablet Take 1 tablet (20 mg total) by mouth 2 (two) times daily for 14 days. 04/07/21 04/21/21 Yes Emani Taussig, Elita Boone, NP  albuterol (PROVENTIL HFA;VENTOLIN HFA) 108 (90 Base) MCG/ACT inhaler Inhale 2 puffs into the lungs every 4 (four) hours as needed for wheezing or shortness of breath (cough, shortness of breath or wheezing.). 04/11/18   Elvina Sidle, MD  clindamycin (CLEOCIN) 300 MG capsule Take 1 capsule (300 mg total) by mouth 3 (three) times daily. 01/02/21   Mickie Bail, NP  cyclobenzaprine (FLEXERIL) 5 MG tablet Take 1 tablet (5 mg total) by mouth at bedtime. 06/30/18   Georgetta Haber, NP  ibuprofen (ADVIL) 800 MG tablet Take 1 tablet (800 mg total) by mouth every 8 (eight) hours as needed. 01/02/21   Mickie Bail, NP  meloxicam (MOBIC) 15 MG tablet Take 1 tablet (15 mg total) by mouth  daily. 06/30/18   Georgetta Haber, NP    Family History History reviewed. No pertinent family history.  Social History Social History   Tobacco Use   Smoking status: Every Day    Packs/day: 1.00    Pack years: 0.00    Types: Cigarettes   Smokeless tobacco: Never  Substance Use Topics   Alcohol use: Yes    Alcohol/week: 15.0 standard drinks    Types: 15 Cans of beer per week   Drug use: No     Allergies   Penicillins   Review of Systems Review of Systems  Constitutional: Negative.   Respiratory: Negative.    Cardiovascular: Negative.   Gastrointestinal:  Positive for abdominal pain. Negative for abdominal distention, anal bleeding, blood in stool, constipation, diarrhea, nausea, rectal pain and vomiting.  Endocrine: Negative.   Skin: Negative.     Physical Exam Triage Vital Signs ED Triage Vitals  Enc Vitals Group     BP 04/07/21 0849 105/68     Pulse Rate 04/07/21 0849 88     Resp 04/07/21 0849 20     Temp 04/07/21 0849 98.5 F (36.9 C)     Temp Source 04/07/21 0849 Oral     SpO2 04/07/21 0849 100 %     Weight --      Height --      Head Circumference --      Peak Flow --  Pain Score 04/07/21 0850 8     Pain Loc --      Pain Edu? --      Excl. in GC? --    No data found.  Updated Vital Signs BP 105/68 (BP Location: Left Arm)   Pulse 88   Temp 98.5 F (36.9 C) (Oral)   Resp 20   SpO2 100%   Visual Acuity Right Eye Distance:   Left Eye Distance:   Bilateral Distance:    Right Eye Near:   Left Eye Near:    Bilateral Near:     Physical Exam Constitutional:      Appearance: He is well-developed and normal weight.  HENT:     Head: Normocephalic.  Eyes:     Extraocular Movements: Extraocular movements intact.  Pulmonary:     Effort: Pulmonary effort is normal.  Abdominal:     General: Abdomen is flat. Bowel sounds are normal.     Palpations: Abdomen is soft.     Tenderness: There is abdominal tenderness in the right upper quadrant  and epigastric area. There is no guarding or rebound.     Hernia: No hernia is present.  Skin:    General: Skin is warm and dry.  Neurological:     Mental Status: He is alert and oriented to person, place, and time.  Psychiatric:        Mood and Affect: Mood normal.        Behavior: Behavior normal.     UC Treatments / Results  Labs (all labs ordered are listed, but only abnormal results are displayed) Labs Reviewed - No data to display  EKG   Radiology No results found.  Procedures Procedures (including critical care time)  Medications Ordered in UC Medications - No data to display  Initial Impression / Assessment and Plan / UC Course  I have reviewed the triage vital signs and the nursing notes.  Pertinent labs & imaging results that were available during my care of the patient were reviewed by me and considered in my medical decision making (see chart for details).  RUQ abdominal pain  Bentyl 100 mg bid for 14 days, follow up with PCP for persistent pain Advised 800 mg of ibuprofen tid for additional pain control, discussed proper use of medication, verbalized understanding.  Final Clinical Impressions(s) / UC Diagnoses   Final diagnoses:  Right upper quadrant abdominal pain     Discharge Instructions      Can take medication twice a day for the next 14 days, if pain persist please follow up with primary care doctor  Can continue use of ibuprofen 800 mg three times a day, please do not exceed dosage, can irritate kidneys      ED Prescriptions     Medication Sig Dispense Auth. Provider   dicyclomine (BENTYL) 20 MG tablet Take 1 tablet (20 mg total) by mouth 2 (two) times daily for 14 days. 28 tablet Marla Pouliot, Elita Boone, NP      PDMP not reviewed this encounter.   Valinda Hoar, [place] 04/07/21 [phone]

## 2021-04-07 NOTE — ED Triage Notes (Signed)
Pt presents with intermittent abdominal cramping X 2 weeks.

## 2021-04-07 NOTE — Discharge Instructions (Addendum)
Can take medication twice a day for the next 14 days, if pain persist please follow up with primary care doctor  Can continue use of ibuprofen 800 mg three times a day, please do not exceed dosage, can irritate kidneys

## 2021-11-06 ENCOUNTER — Other Ambulatory Visit: Payer: Self-pay

## 2021-11-06 ENCOUNTER — Ambulatory Visit
Admission: EM | Admit: 2021-11-06 | Discharge: 2021-11-06 | Disposition: A | Discharge status: Home / Self Care | Payer: Medicaid Other

## 2021-11-06 ENCOUNTER — Encounter: Payer: Self-pay | Admitting: Emergency Medicine

## 2021-11-06 DIAGNOSIS — R1011 Right upper quadrant pain: Secondary | ICD-10-CM

## 2021-11-06 NOTE — ED Provider Notes (Signed)
[facility]    CSN: [id number] Arrival date & time: 11/06/21  1055      History   Chief Complaint Chief Complaint  Patient presents with   Abdominal Pain    HPI Dwayne OmsKenyaida V Miller is a 36 y.o. male.   Patient presents with severe right upper quadrant pain that has been present for several months but has worsened over the past few weeks.  Patient reports that the pain brought him to his knees yesterday.  Pain is rated 5/10 on pain scale at this time but reports that it is typically 10/10 on pain scale when it is most severe.  Pain is intermittent and usually lasts for about 15 minutes at a time.  Pain worsens with eating at times.  No relieving factors to pain.  Had 1 episode of vomiting yesterday.  Denies diarrhea or constipation.  Denies blood in stool.  Pain does not radiate.  No blood in emesis.  He describes the pain as "someone is twisting his intestines".  He was recently seen by different urgent care in June 2022.  Prescribed dicyclomine with minimal improvement in symptoms.  He advised healthcare provider at that time that he was being worked up by PCP and had negative blood work completed at that time.  He reports that he has also had an ultrasound since being seen that showed a "fatty liver".  He has an appointment on February 8 with GI to possibly have an upper endoscopy completed.   Abdominal Pain  History reviewed. No pertinent past medical history.  There are no problems to display for this patient.   History reviewed. No pertinent surgical history.     Home Medications    Prior to Admission medications   Medication Sig Start Date End Date Taking? Authorizing Provider  albuterol (PROVENTIL HFA;VENTOLIN HFA) 108 (90 Base) MCG/ACT inhaler Inhale 2 puffs into the lungs every 4 (four) hours as needed for wheezing or shortness of breath (cough, shortness of breath or wheezing.). 04/11/18  Yes Elvina SidleLauenstein, Kurt, MD  clindamycin (CLEOCIN) 300 MG capsule Take  1 capsule (300 mg total) by mouth 3 (three) times daily. 01/02/21   Mickie Bailate, Kelly H, NP  cyclobenzaprine (FLEXERIL) 5 MG tablet Take 1 tablet (5 mg total) by mouth at bedtime. 06/30/18   Georgetta HaberBurky, Natalie B, NP  dicyclomine (BENTYL) 20 MG tablet Take 1 tablet (20 mg total) by mouth 2 (two) times daily for 14 days. 04/07/21 04/21/21  Valinda HoarWhite, Adrienne R, NP  ibuprofen (ADVIL) 800 MG tablet Take 1 tablet (800 mg total) by mouth every 8 (eight) hours as needed. 01/02/21   Mickie Bailate, Kelly H, NP  meloxicam (MOBIC) 15 MG tablet Take 1 tablet (15 mg total) by mouth daily. 06/30/18   Georgetta HaberBurky, Natalie B, NP    Family History No family history on file.  Social History Social History   Tobacco Use   Smoking status: Every Day    Packs/day: 1.00    Types: Cigarettes   Smokeless tobacco: Never  Substance Use Topics   Alcohol use: Yes    Alcohol/week: 15.0 standard drinks    Types: 15 Cans of beer per week   Drug use: No     Allergies   Penicillins   Review of Systems Review of Systems Per HPI  Physical Exam Triage Vital Signs ED Triage Vitals  Enc Vitals Group     BP 11/06/21 1216 113/78     Pulse Rate 11/06/21 1216 92     Resp 11/06/21  1216 18     Temp 11/06/21 1216 97.8 F (36.6 C)     Temp Source 11/06/21 1216 Oral     SpO2 11/06/21 1216 99 %     Weight 11/06/21 1217 150 lb (68 kg)     Height 11/06/21 1217 5\' 11"  (1.803 m)     Head Circumference --      Peak Flow --      Pain Score 11/06/21 1217 5     Pain Loc --      Pain Edu? --      Excl. in GC? --    No data found.  Updated Vital Signs BP 113/78 (BP Location: Left Arm)    Pulse 92    Temp 97.8 F (36.6 C) (Oral)    Resp 18    Ht 5\' 11"  (1.803 m)    Wt 150 lb (68 kg)    SpO2 99%    BMI 20.92 kg/m   Visual Acuity Right Eye Distance:   Left Eye Distance:   Bilateral Distance:    Right Eye Near:   Left Eye Near:    Bilateral Near:     Physical Exam Constitutional:      General: He is not in acute distress.    Appearance:  Normal appearance. He is not toxic-appearing or diaphoretic.  HENT:     Head: Normocephalic and atraumatic.  Eyes:     Extraocular Movements: Extraocular movements intact.     Conjunctiva/sclera: Conjunctivae normal.  Cardiovascular:     Rate and Rhythm: Normal rate and regular rhythm.     Pulses: Normal pulses.     Heart sounds: Normal heart sounds.  Pulmonary:     Effort: Pulmonary effort is normal. No respiratory distress.     Breath sounds: Normal breath sounds.  Abdominal:     General: Abdomen is flat. Bowel sounds are normal. There is no distension.     Palpations: Abdomen is soft.     Tenderness: There is abdominal tenderness in the right upper quadrant and epigastric area.  Neurological:     General: No focal deficit present.     Mental Status: He is alert and oriented to person, place, and time. Mental status is at baseline.  Psychiatric:        Mood and Affect: Mood normal.        Behavior: Behavior normal.        Thought Content: Thought content normal.        Judgment: Judgment normal.     UC Treatments / Results  Labs (all labs ordered are listed, but only abnormal results are displayed) Labs Reviewed - No data to display  EKG   Radiology No results found.  Procedures Procedures (including critical care time)  Medications Ordered in UC Medications - No data to display  Initial Impression / Assessment and Plan / UC Course  I have reviewed the triage vital signs and the nursing notes.  Pertinent labs & imaging results that were available during my care of the patient were reviewed by me and considered in my medical decision making (see chart for details).     Advised patient that he would need to go to the emergency department for further evaluation and management given that pain has become more severe over the past few days.  May need more advanced imaging completed such as a CT scan.  Patient was agreeable with plan.  Vital signs stable at discharge.   Agree with patient self transport  to hospital. Final Clinical Impressions(s) / UC Diagnoses   Final diagnoses:  Right upper quadrant pain     Discharge Instructions      Please go to the emergency department as soon as you leave urgent care for further evaluation and management.    ED Prescriptions   None    PDMP not reviewed this encounter.   Gustavus Bryant, [place] 11/06/21 1252

## 2021-11-06 NOTE — ED Triage Notes (Signed)
Pt c/o RUQ on and off for several weeks.  Pt notices sometimes after eating.  The pain is described as "someone twisting his intestines."  Pt did vomit x 1.  Has an appt with GI 11/26/21 but pain is worse.

## 2021-11-06 NOTE — Discharge Instructions (Signed)
Please go to the emergency department as soon as you leave urgent care for further evaluation and management. ?

## 2022-10-29 ENCOUNTER — Ambulatory Visit
Admission: EM | Admit: 2022-10-29 | Discharge: 2022-10-29 | Disposition: A | Discharge status: Home / Self Care | Payer: Medicaid Other | Attending: Physician Assistant | Admitting: Physician Assistant

## 2022-10-29 DIAGNOSIS — J069 Acute upper respiratory infection, unspecified: Secondary | ICD-10-CM | POA: Diagnosis present

## 2022-10-29 DIAGNOSIS — Z1152 Encounter for screening for COVID-19: Secondary | ICD-10-CM | POA: Insufficient documentation

## 2022-10-29 DIAGNOSIS — R6889 Other general symptoms and signs: Secondary | ICD-10-CM | POA: Diagnosis present

## 2022-10-29 MED ORDER — ALBUTEROL SULFATE HFA 108 (90 BASE) MCG/ACT IN AERS: 1.0000 | INHALATION_SPRAY | Freq: Four times a day (QID) | RESPIRATORY_TRACT | 0 refills | Status: DC | PRN

## 2022-10-29 MED ORDER — OSELTAMIVIR PHOSPHATE 75 MG PO CAPS: 75.0000 mg | ORAL_CAPSULE | Freq: Two times a day (BID) | ORAL | 0 refills | Status: DC

## 2022-10-29 NOTE — ED Triage Notes (Signed)
Pt presents to uc with co of generalized body aches and chills, sore throat, ha since yesterday. Pt reports otc tylenol.

## 2022-10-29 NOTE — ED Provider Notes (Signed)
[facility]    CSN: [id number] Arrival date & time: 10/29/22  1008      History   Chief Complaint Chief Complaint  Patient presents with   Sore Throat   Generalized Body Aches   Chills    HPI Dwayne Miller is a 37 y.o. male.   Patient here today for evaluation of generalized body aches, chills, suspected fever, cough and sore throat that started yesterday. He has not checked his temperature. He has tried taking tylenol with mild relief. He denies any vomiting and diarrhea.   The history is provided by the patient.  Sore Throat Associated symptoms include headaches. Pertinent negatives include no abdominal pain and no shortness of breath.    History reviewed. No pertinent past medical history.  There are no problems to display for this patient.   History reviewed. No pertinent surgical history.     Home Medications    Prior to Admission medications   Medication Sig Start Date End Date Taking? Authorizing Provider  albuterol (VENTOLIN HFA) 108 (90 Base) MCG/ACT inhaler Inhale 1-2 puffs into the lungs every 6 (six) hours as needed for wheezing or shortness of breath. 10/29/22  Yes Francene Finders, PA-C  oseltamivir (TAMIFLU) 75 MG capsule Take 1 capsule (75 mg total) by mouth every 12 (twelve) hours. 10/29/22  Yes Francene Finders, PA-C  pantoprazole (PROTONIX) 40 MG tablet Take 40 mg by mouth 2 (two) times daily. 05/08/22  Yes [provider]  clindamycin (CLEOCIN) 300 MG capsule Take 1 capsule (300 mg total) by mouth 3 (three) times daily. Patient not taking: Reported on 10/29/2022 01/02/21   Sharion Balloon, NP    Family History History reviewed. No pertinent family history.  Social History Social History   Tobacco Use   Smoking status: Every Day    Packs/day: 1.00    Types: Cigarettes   Smokeless tobacco: Never  Substance Use Topics   Alcohol use: Yes    Alcohol/week: 15.0 standard drinks of alcohol    Types: 15 Cans of beer per  week   Drug use: No     Allergies   Penicillins   Review of Systems Review of Systems  Constitutional:  Positive for chills and fever (subjective).  HENT:  Positive for congestion and sore throat. Negative for ear pain.   Eyes:  Negative for discharge and redness.  Respiratory:  Positive for cough. Negative for shortness of breath.   Gastrointestinal:  Negative for abdominal pain, diarrhea, nausea and vomiting.  Musculoskeletal:  Positive for myalgias.  Neurological:  Positive for headaches.     Physical Exam Triage Vital Signs ED Triage Vitals  Enc Vitals Group     BP 10/29/22 1111 137/78     Pulse Rate 10/29/22 1111 78     Resp 10/29/22 1111 20     Temp 10/29/22 1111 97.9 F (36.6 C)     Temp src --      SpO2 10/29/22 1111 98 %     Weight --      Height --      Head Circumference --      Peak Flow --      Pain Score 10/29/22 1109 4     Pain Loc --      Pain Edu? --      Excl. in [place]? --    No data found.  Updated Vital Signs BP 137/78   Pulse 78   Temp 97.9 F (36.6 C)  Resp 20   SpO2 98%       Physical Exam Vitals and nursing note reviewed.  Constitutional:      General: He is not in acute distress.    Appearance: Normal appearance. He is not ill-appearing.  HENT:     Head: Normocephalic and atraumatic.     Nose: Congestion present.     Mouth/Throat:     Mouth: Mucous membranes are moist.     Pharynx: Oropharynx is clear. No oropharyngeal exudate or posterior oropharyngeal erythema.  Eyes:     Conjunctiva/sclera: Conjunctivae normal.  Cardiovascular:     Rate and Rhythm: Normal rate and regular rhythm.     Heart sounds: Normal heart sounds. No murmur heard. Pulmonary:     Effort: Pulmonary effort is normal. No respiratory distress.     Breath sounds: Wheezing (mild upper lung fields) present. No rhonchi or rales.  Skin:    General: Skin is warm and dry.  Neurological:     Mental Status: He is alert.  Psychiatric:        Mood and Affect:  Mood normal.        Thought Content: Thought content normal.      UC Treatments / Results  Labs (all labs ordered are listed, but only abnormal results are displayed) Labs Reviewed  SARS CORONAVIRUS 2 (TAT 6-24 HRS)    EKG   Radiology No results found.  Procedures Procedures (including critical care time)  Medications Ordered in UC Medications - No data to display  Initial Impression / Assessment and Plan / UC Course  I have reviewed the triage vital signs and the nursing notes.  Pertinent labs & imaging results that were available during my care of the patient were reviewed by me and considered in my medical decision making (see chart for details).    Will treat to cover influenza with tamiflu and will screen for covid. Albuterol inhaler prescribed given wheezing. Encouraged continued symptomatic treatment, increased fluids and rest with follow up if no gradual improvement or with any further concerns.   Final Clinical Impressions(s) / UC Diagnoses   Final diagnoses:  Acute upper respiratory infection  Encounter for screening for COVID-19  Flu-like symptoms   Discharge Instructions   None    ED Prescriptions     Medication Sig Dispense Auth. Provider   oseltamivir (TAMIFLU) 75 MG capsule Take 1 capsule (75 mg total) by mouth every 12 (twelve) hours. 10 capsule Francene Finders, PA-C   albuterol (VENTOLIN HFA) 108 (90 Base) MCG/ACT inhaler Inhale 1-2 puffs into the lungs every 6 (six) hours as needed for wheezing or shortness of breath. 8 g Francene Finders, PA-C      PDMP not reviewed this encounter.   Francene Finders, PA-C 10/29/22 1435

## 2022-10-30 LAB — SARS CORONAVIRUS 2 (TAT 6-24 HRS): SARS Coronavirus 2: NEGATIVE

## 2023-04-01 ENCOUNTER — Ambulatory Visit
Admission: EM | Admit: 2023-04-01 | Discharge: 2023-04-01 | Disposition: A | Discharge status: Home / Self Care | Payer: Medicaid Other | Attending: Family Medicine | Admitting: Family Medicine

## 2023-04-01 DIAGNOSIS — K047 Periapical abscess without sinus: Secondary | ICD-10-CM

## 2023-04-01 MED ORDER — CLINDAMYCIN HCL 300 MG PO CAPS: 300.0000 mg | ORAL_CAPSULE | Freq: Three times a day (TID) | ORAL | 0 refills | Status: AC

## 2023-04-01 MED ORDER — IBUPROFEN 800 MG PO TABS: 800.0000 mg | ORAL_TABLET | Freq: Three times a day (TID) | ORAL | 0 refills | Status: AC | PRN

## 2023-04-01 NOTE — ED Triage Notes (Signed)
Pt is here for dental abscess with swelling and pain.

## 2023-04-01 NOTE — Discharge Instructions (Signed)
--  Take clindamycin 300 mg-- 1 capsule 3 times daily for 7 days  Take ibuprofen 800 mg--1 tab every 8 hours as needed for pain.  Follow-up with your dentist

## 2023-04-01 NOTE — ED Provider Notes (Signed)
[facility]    CSN: [id number] Arrival date & time: 04/01/23  1156      History   Chief Complaint Chief Complaint  Patient presents with   Abscess    HPI Dwayne Miller is a 37 y.o. male.    Abscess  Here for pain and swelling in his left jaw and mouth.  On June night started having a little pain in his left lower jaw.  By July 11 he had some swelling and more pain.  No fever or chills.  He does have a dentist and plans to make a follow-up appointment with them History reviewed. No pertinent past medical history.  There are no problems to display for this patient.   History reviewed. No pertinent surgical history.     Home Medications    Prior to Admission medications   Medication Sig Start Date End Date Taking? Authorizing Provider  clindamycin (CLEOCIN) 300 MG capsule Take 1 capsule (300 mg total) by mouth 3 (three) times daily for 7 days. 04/01/23 04/08/23 Yes Zenia Resides, MD  ibuprofen (ADVIL) 800 MG tablet Take 1 tablet (800 mg total) by mouth every 8 (eight) hours as needed (pain). 04/01/23  Yes Bexton Haak, Janace Aris, MD  albuterol (VENTOLIN HFA) 108 (90 Base) MCG/ACT inhaler Inhale 1-2 puffs into the lungs every 6 (six) hours as needed for wheezing or shortness of breath. 10/29/22   Tomi Bamberger, PA-C  pantoprazole (PROTONIX) 40 MG tablet Take 40 mg by mouth 2 (two) times daily. 05/08/22   [provider]    Family History History reviewed. No pertinent family history.  Social History Social History   Tobacco Use   Smoking status: Every Day    Packs/day: 1    Types: Cigarettes   Smokeless tobacco: Never  Substance Use Topics   Alcohol use: Yes    Alcohol/week: 15.0 standard drinks of alcohol    Types: 15 Cans of beer per week   Drug use: No     Allergies   Penicillins   Review of Systems Review of Systems   Physical Exam Triage Vital Signs ED Triage Vitals [04/01/23 1243]  Enc Vitals Group     BP 109/76      Pulse Rate 76     Resp 18     Temp 97.7 F (36.5 C)     Temp Source Oral     SpO2 98 %     Weight      Height      Head Circumference      Peak Flow      Pain Score      Pain Loc      Pain Edu?      Excl. in GC?    No data found.  Updated Vital Signs BP 109/76 (BP Location: Left Arm)   Pulse 76   Temp 97.7 F (36.5 C) (Oral)   Resp 18   SpO2 98%   Visual Acuity Right Eye Distance:   Left Eye Distance:   Bilateral Distance:    Right Eye Near:   Left Eye Near:    Bilateral Near:     Physical Exam Vitals reviewed.  Constitutional:      General: He is not in acute distress.    Appearance: He is not ill-appearing, toxic-appearing or diaphoretic.  HENT:     Head:     Comments: There is some mild swelling of the left lower jaw.  There is no fluctuance there.  No erythema there    Mouth/Throat:     Mouth: Mucous membranes are moist.     Comments: Swelling of the lateral dental ridge on the lower part of his left mouth.  There is no fluctuance no drainage in the mouth. Cardiovascular:     Rate and Rhythm: Normal rate and regular rhythm.     Heart sounds: No murmur heard. Pulmonary:     Effort: Pulmonary effort is normal.     Breath sounds: Normal breath sounds.  Musculoskeletal:     Cervical back: Neck supple.  Lymphadenopathy:     Cervical: No cervical adenopathy.  Skin:    Coloration: Skin is not jaundiced or pale.  Neurological:     General: No focal deficit present.     Mental Status: He is alert and oriented to person, place, and time.  Psychiatric:        Behavior: Behavior normal.      UC Treatments / Results  Labs (all labs ordered are listed, but only abnormal results are displayed) Labs Reviewed - No data to display  EKG   Radiology No results found.  Procedures Procedures (including critical care time)  Medications Ordered in UC Medications - No data to display  Initial Impression / Assessment and Plan / UC Course  I have  reviewed the triage vital signs and the nursing notes.  Pertinent labs & imaging results that were available during my care of the patient were reviewed by me and considered in my medical decision making (see chart for details).      He is allergic to penicillins, so clindamycin is sent in for the infection.  He declines my offer of Toradol.  Ibuprofen is sent in for pain.  He will follow-up with his dentist Final Clinical Impressions(s) / UC Diagnoses   Final diagnoses:  Dental infection     Discharge Instructions      --Take clindamycin 300 mg-- 1 capsule 3 times daily for 7 days  Take ibuprofen 800 mg--1 tab every 8 hours as needed for pain.  Follow-up with your dentist     ED Prescriptions     Medication Sig Dispense Auth. Provider   clindamycin (CLEOCIN) 300 MG capsule Take 1 capsule (300 mg total) by mouth 3 (three) times daily for 7 days. 21 capsule Zenia Resides, MD   ibuprofen (ADVIL) 800 MG tablet Take 1 tablet (800 mg total) by mouth every 8 (eight) hours as needed (pain). 21 tablet Dillin Lofgren, Janace Aris, MD      I have reviewed the PDMP during this encounter.   Zenia Resides, MD 04/01/23 1254

## 2023-09-03 ENCOUNTER — Ambulatory Visit
Admission: EM | Admit: 2023-09-03 | Discharge: 2023-09-03 | Disposition: A | Discharge status: Home / Self Care | Payer: Medicaid Other | Attending: Internal Medicine | Admitting: Internal Medicine

## 2023-09-03 ENCOUNTER — Other Ambulatory Visit: Payer: Self-pay

## 2023-09-03 DIAGNOSIS — R300 Dysuria: Secondary | ICD-10-CM | POA: Diagnosis not present

## 2023-09-03 DIAGNOSIS — Z113 Encounter for screening for infections with a predominantly sexual mode of transmission: Secondary | ICD-10-CM | POA: Insufficient documentation

## 2023-09-03 DIAGNOSIS — R369 Urethral discharge, unspecified: Secondary | ICD-10-CM | POA: Insufficient documentation

## 2023-09-03 DIAGNOSIS — Z202 Contact with and (suspected) exposure to infections with a predominantly sexual mode of transmission: Secondary | ICD-10-CM | POA: Insufficient documentation

## 2023-09-03 MED ORDER — METRONIDAZOLE 500 MG PO TABS
2000.0000 mg | ORAL_TABLET | Freq: Once | ORAL | Status: AC
  Administered 2023-09-03: 2000 mg via ORAL

## 2023-09-03 NOTE — ED Provider Notes (Addendum)
[facility]    CSN: [id number] Arrival date & time: 09/03/23  1028      History   Chief Complaint Chief Complaint  Patient presents with   SEXUALLY TRANSMITTED DISEASE    HPI Dwayne Miller is a 37 y.o. male.   Patient presents with urinary burning, penile itching, penile discharge that started a few days ago.  Reports that one of his sexual partners recently told him that they had trichomonas.  He did have unprotected intercourse with them a few weeks prior.     History reviewed. No pertinent past medical history.  There are no problems to display for this patient.   History reviewed. No pertinent surgical history.     Home Medications    Prior to Admission medications   Medication Sig Start Date End Date Taking? Authorizing Provider  albuterol (VENTOLIN HFA) 108 (90 Base) MCG/ACT inhaler Inhale 1-2 puffs into the lungs every 6 (six) hours as needed for wheezing or shortness of breath. 10/29/22   Tomi Bamberger, PA-C  ibuprofen (ADVIL) 800 MG tablet Take 1 tablet (800 mg total) by mouth every 8 (eight) hours as needed (pain). 04/01/23   Zenia Resides, MD  pantoprazole (PROTONIX) 40 MG tablet Take 40 mg by mouth 2 (two) times daily. 05/08/22   [provider]    Family History History reviewed. No pertinent family history.  Social History Social History   Tobacco Use   Smoking status: Every Day    Current packs/day: 1.00    Types: Cigarettes   Smokeless tobacco: Never  Substance Use Topics   Alcohol use: Yes    Alcohol/week: 15.0 standard drinks of alcohol    Types: 15 Cans of beer per week   Drug use: No     Allergies   Penicillins   Review of Systems Review of Systems Per HPI  Physical Exam Triage Vital Signs ED Triage Vitals  Encounter Vitals Group     BP 09/03/23 1129 117/68     Systolic BP Percentile --      Diastolic BP Percentile --      Pulse Rate 09/03/23 1129 98     Resp 09/03/23 1129 16     Temp  09/03/23 1129 (!) 97.5 F (36.4 C)     Temp Source 09/03/23 1129 Oral     SpO2 09/03/23 1129 98 %     Weight --      Height --      Head Circumference --      Peak Flow --      Pain Score 09/03/23 1131 0     Pain Loc --      Pain Education --      Exclude from Growth Chart --    No data found.  Updated Vital Signs BP 117/68 (BP Location: Left Arm)   Pulse 98   Temp (!) 97.5 F (36.4 C) (Oral)   Resp 16   SpO2 98%   Visual Acuity Right Eye Distance:   Left Eye Distance:   Bilateral Distance:    Right Eye Near:   Left Eye Near:    Bilateral Near:     Physical Exam Constitutional:      General: He is not in acute distress.    Appearance: Normal appearance. He is not toxic-appearing or diaphoretic.  HENT:     Head: Normocephalic and atraumatic.  Eyes:     Extraocular Movements: Extraocular movements intact.     Conjunctiva/sclera: Conjunctivae normal.  Pulmonary:     Effort: Pulmonary effort is normal.  Genitourinary:    Comments: Deferred with shared decision making.  Self swab performed. Neurological:     General: No focal deficit present.     Mental Status: He is alert and oriented to person, place, and time. Mental status is at baseline.  Psychiatric:        Mood and Affect: Mood normal.        Behavior: Behavior normal.        Thought Content: Thought content normal.        Judgment: Judgment normal.      UC Treatments / Results  Labs (all labs ordered are listed, but only abnormal results are displayed) Labs Reviewed  CYTOLOGY, (ORAL, ANAL, URETHRAL) ANCILLARY ONLY    EKG   Radiology No results found.  Procedures Procedures (including critical care time)  Medications Ordered in UC Medications  metroNIDAZOLE (FLAGYL) tablet 2,000 mg (2,000 mg Oral Given 09/03/23 1201)    Initial Impression / Assessment and Plan / UC Course  I have reviewed the triage vital signs and the nursing notes.  Pertinent labs & imaging results that were  available during my care of the patient were reviewed by me and considered in my medical decision making (see chart for details).     Given confirmed trichomonas exposure,  Patient was treated with 2 g Flagyl today in urgent care as a one-time dose.  Cytology swab pending.  Attempted to get urine sample from patient but he was not successful.  Patient was sent home with urinalysis collection container to bring back to urgent care for testing.  Will place future order for this.  Advised safe sex practices and refraining from sexual activity until test results and treatment are complete.  Advised strict follow-up precautions.  Patient verbalized understanding and was agreeable with plan. Final Clinical Impressions(s) / UC Diagnoses   Final diagnoses:  Trichomonas exposure  Screening examination for venereal disease  Dysuria  Penile discharge     Discharge Instructions      You were given medication for trichomonas exposure.  Penile swab is pending.  We will call if it is abnormal.  Refrain from sexual activity until test results and treatment are complete as we discussed.  Follow-up if any symptoms persist or worsen.    ED Prescriptions   None    PDMP not reviewed this encounter.   Gustavus Bryant, [place] 09/03/23 1243    Gustavus Bryant, [place] 09/03/23 1247

## 2023-09-03 NOTE — Discharge Instructions (Signed)
You were given medication for trichomonas exposure.  Penile swab is pending.  We will call if it is abnormal.  Refrain from sexual activity until test results and treatment are complete as we discussed.  Follow-up if any symptoms persist or worsen.

## 2023-09-03 NOTE — ED Triage Notes (Signed)
Pt states burning with urination,itching and penile discharge.  States one of his partners told him she had trichomoniasis.

## 2023-09-06 LAB — CYTOLOGY, (ORAL, ANAL, URETHRAL) ANCILLARY ONLY
Chlamydia: NEGATIVE
Comment: NEGATIVE
Comment: NEGATIVE
Comment: NORMAL
Neisseria Gonorrhea: NEGATIVE
Trichomonas: NEGATIVE

## 2023-10-21 ENCOUNTER — Ambulatory Visit
Admission: EM | Admit: 2023-10-21 | Discharge: 2023-10-21 | Disposition: A | Discharge status: Home / Self Care | Payer: Medicaid Other | Attending: Physician Assistant | Admitting: Physician Assistant

## 2023-10-21 DIAGNOSIS — K047 Periapical abscess without sinus: Secondary | ICD-10-CM

## 2023-10-21 MED ORDER — CLINDAMYCIN HCL 300 MG PO CAPS: 300.0000 mg | ORAL_CAPSULE | Freq: Three times a day (TID) | ORAL | 0 refills | Status: AC

## 2023-10-21 MED ORDER — HYDROCODONE-ACETAMINOPHEN 5-325 MG PO TABS: 1.0000 | ORAL_TABLET | ORAL | 0 refills | Status: AC | PRN

## 2023-10-21 NOTE — Discharge Instructions (Signed)
See your dentist for evaluation  

## 2023-10-21 NOTE — ED Provider Notes (Signed)
 [facility]    CSN: [id number] Arrival date & time: 10/21/23  9161      History   Chief Complaint Chief Complaint  Patient presents with   Abscess    HPI Dwayne Miller is a 38 y.o. male.   Patient complains of an abscess to a tooth on the left side of his face.  Patient complains of swelling and pain  The history is provided by the patient and the spouse. No language interpreter was used.  Abscess   History reviewed. No pertinent past medical history.  There are no active problems to display for this patient.   History reviewed. No pertinent surgical history.     Home Medications    Prior to Admission medications   Medication Sig Start Date End Date Taking? Authorizing Provider  clindamycin  (CLEOCIN ) 300 MG capsule Take 1 capsule (300 mg total) by mouth 3 (three) times daily for 10 days. 10/21/23 10/31/23 Yes Adaly Puder K, PA-C  HYDROcodone -acetaminophen  (NORCO/VICODIN) 5-325 MG tablet Take 1 tablet by mouth every 4 (four) hours as needed for moderate pain (pain score 4-6). 10/21/23 10/20/24 Yes Flint Sonny POUR, PA-C  albuterol  (VENTOLIN  HFA) 108 (90 Base) MCG/ACT inhaler Inhale 1-2 puffs into the lungs every 6 (six) hours as needed for wheezing or shortness of breath. 10/29/22   Billy Asberry FALCON, PA-C  ibuprofen  (ADVIL ) 800 MG tablet Take 1 tablet (800 mg total) by mouth every 8 (eight) hours as needed (pain). 04/01/23   Banister, Pamela K, MD  pantoprazole (PROTONIX) 40 MG tablet Take 40 mg by mouth 2 (two) times daily. 05/08/22   [provider]    Family History No family history on file.  Social History Social History   Tobacco Use   Smoking status: Every Day    Current packs/day: 1.00    Types: Cigarettes   Smokeless tobacco: Never  Substance Use Topics   Alcohol use: Not Currently    Alcohol/week: 15.0 standard drinks of alcohol    Types: 15 Cans of beer per week   Drug use: No     Allergies   Penicillins   Review of  Systems Review of Systems  All other systems reviewed and are negative.    Physical Exam Triage Vital Signs ED Triage Vitals  Encounter Vitals Group     BP 10/21/23 0915 120/79     Systolic BP Percentile --      Diastolic BP Percentile --      Pulse Rate 10/21/23 0911 82     Resp 10/21/23 0911 18     Temp 10/21/23 0911 98.9 F (37.2 C)     Temp Source 10/21/23 0911 Oral     SpO2 10/21/23 0911 100 %     Weight 10/21/23 0912 160 lb (72.6 kg)     Height 10/21/23 0912 5' 11 (1.803 m)     Head Circumference --      Peak Flow --      Pain Score 10/21/23 0909 8     Pain Loc --      Pain Education --      Exclude from Growth Chart --    No data found.  Updated Vital Signs BP 120/79 (BP Location: Left Arm)   Pulse 82   Temp 98.9 F (37.2 C) (Oral)   Resp 18   Ht 5' 11 (1.803 m)   Wt 72.6 kg   SpO2 100%   BMI 22.32 kg/m   Visual Acuity Right Eye Distance:  Left Eye Distance:   Bilateral Distance:    Right Eye Near:   Left Eye Near:    Bilateral Near:     Physical Exam Vitals and nursing note reviewed.  Constitutional:      Appearance: He is well-developed.  HENT:     Head: Normocephalic.     Mouth/Throat:     Mouth: Mucous membranes are moist.     Comments: Swelling left side of face abscess left lower gum, Cardiovascular:     Rate and Rhythm: Normal rate.  Pulmonary:     Effort: Pulmonary effort is normal.  Abdominal:     General: There is no distension.  Musculoskeletal:        General: Normal range of motion.     Cervical back: Normal range of motion.  Skin:    General: Skin is warm.  Neurological:     General: No focal deficit present.     Mental Status: He is alert and oriented to person, place, and time.      UC Treatments / Results  Labs (all labs ordered are listed, but only abnormal results are displayed) Labs Reviewed - No data to display  EKG   Radiology No results found.  Procedures Procedures (including critical care  time)  Medications Ordered in UC Medications - No data to display  Initial Impression / Assessment and Plan / UC Course  I have reviewed the triage vital signs and the nursing notes.  Pertinent labs & imaging results that were available during my care of the patient were reviewed by me and considered in my medical decision making (see chart for details).     Given a prescription for clindamycin  and hydrocodone .  He is advised to call his dentist to schedule an appointment patient advised warm salt water gargles 20 minutes 4 times a day he should return if any problems. Final Clinical Impressions(s) / UC Diagnoses   Final diagnoses:  Dental abscess     Discharge Instructions      See your dentist for evaluation    ED Prescriptions     Medication Sig Dispense Auth. Provider   clindamycin  (CLEOCIN ) 300 MG capsule Take 1 capsule (300 mg total) by mouth 3 (three) times daily for 10 days. 30 capsule Josph Norfleet K, PA-C   HYDROcodone -acetaminophen  (NORCO/VICODIN) 5-325 MG tablet Take 1 tablet by mouth every 4 (four) hours as needed for moderate pain (pain score 4-6). 12 tablet Lavonda Thal K, PA-C      I have reviewed the PDMP during this encounter. An After Visit Summary was printed and given to the patient.       Flint Sonny POUR, PA-C 10/21/23 [phone]

## 2023-10-21 NOTE — ED Triage Notes (Signed)
 Patient presents with abscess on left side of face x 2-3 days. Treated with Orajel mouth wash.

## 2024-01-18 ENCOUNTER — Encounter: Payer: Self-pay | Admitting: Emergency Medicine

## 2024-01-18 ENCOUNTER — Ambulatory Visit
Admission: EM | Admit: 2024-01-18 | Discharge: 2024-01-18 | Disposition: A | Discharge status: Home / Self Care | Attending: Internal Medicine | Admitting: Internal Medicine

## 2024-01-18 DIAGNOSIS — R369 Urethral discharge, unspecified: Secondary | ICD-10-CM | POA: Insufficient documentation

## 2024-01-18 DIAGNOSIS — Z113 Encounter for screening for infections with a predominantly sexual mode of transmission: Secondary | ICD-10-CM | POA: Insufficient documentation

## 2024-01-18 DIAGNOSIS — R300 Dysuria: Secondary | ICD-10-CM | POA: Insufficient documentation

## 2024-01-18 LAB — POCT URINALYSIS DIP (MANUAL ENTRY)
Bilirubin, UA: NEGATIVE
Blood, UA: NEGATIVE
Glucose, UA: NEGATIVE mg/dL
Ketones, POC UA: NEGATIVE mg/dL
Nitrite, UA: NEGATIVE
Protein Ur, POC: NEGATIVE mg/dL
Spec Grav, UA: 1.02
Urobilinogen, UA: 0.2 U/dL
pH, UA: 6

## 2024-01-18 NOTE — Discharge Instructions (Signed)
 Penile swab pending.  We will call when it results.

## 2024-01-18 NOTE — ED Provider Notes (Signed)
 [facility]    CSN: [id number] Arrival date & time: 01/18/24  1037      History   Chief Complaint Chief Complaint  Patient presents with   Exposure to STD    HPI Dwayne Miller is a 38 y.o. male.   Patient presents today with dysuria, penile discharge, lower abdominal discomfort that started about a week ago.  Reports that he has had unprotected sexual intercourse, and one of his sexual partners told them that "she was not right".  Although, he reports that this sexual partner did not have a confirmed STD.  He denies confirmed exposure to STD.   Exposure to STD    History reviewed. No pertinent past medical history.  There are no active problems to display for this patient.   History reviewed. No pertinent surgical history.     Home Medications    Prior to Admission medications   Medication Sig Start Date End Date Taking? Authorizing Provider  albuterol (VENTOLIN HFA) 108 (90 Base) MCG/ACT inhaler Inhale 1-2 puffs into the lungs every 6 (six) hours as needed for wheezing or shortness of breath. 10/29/22   Tomi Bamberger, PA-C  HYDROcodone-acetaminophen (NORCO/VICODIN) 5-325 MG tablet Take 1 tablet by mouth every 4 (four) hours as needed for moderate pain (pain score 4-6). 10/21/23 10/20/24  Elson Areas, PA-C  ibuprofen (ADVIL) 800 MG tablet Take 1 tablet (800 mg total) by mouth every 8 (eight) hours as needed (pain). 04/01/23   Zenia Resides, MD  pantoprazole (PROTONIX) 40 MG tablet Take 40 mg by mouth 2 (two) times daily. 05/08/22   [provider]    Family History History reviewed. No pertinent family history.  Social History Social History   Tobacco Use   Smoking status: Every Day    Current packs/day: 1.00    Types: Cigarettes   Smokeless tobacco: Never  Vaping Use   Vaping status: Never Used  Substance Use Topics   Alcohol use: Not Currently    Alcohol/week: 15.0 standard drinks of alcohol    Types: 15 Cans of beer per  week   Drug use: No     Allergies   Penicillins   Review of Systems Review of Systems Per HPI  Physical Exam Triage Vital Signs ED Triage Vitals  Encounter Vitals Group     BP 01/18/24 1259 119/76     Systolic BP Percentile --      Diastolic BP Percentile --      Pulse Rate 01/18/24 1259 86     Resp 01/18/24 1259 16     Temp 01/18/24 1259 97.8 F (36.6 C)     Temp Source 01/18/24 1259 Oral     SpO2 01/18/24 1259 98 %     Weight 01/18/24 1256 160 lb 0.9 oz (72.6 kg)     Height --      Head Circumference --      Peak Flow --      Pain Score 01/18/24 1255 8     Pain Loc --      Pain Education --      Exclude from Growth Chart --    No data found.  Updated Vital Signs BP 119/76 (BP Location: Left Arm)   Pulse 86   Temp 97.8 F (36.6 C) (Oral)   Resp 16   Wt 160 lb 0.9 oz (72.6 kg)   SpO2 98%   BMI 22.32 kg/m   Visual Acuity Right Eye Distance:   Left Eye  Distance:   Bilateral Distance:    Right Eye Near:   Left Eye Near:    Bilateral Near:     Physical Exam Constitutional:      General: He is not in acute distress.    Appearance: Normal appearance. He is not toxic-appearing or diaphoretic.  HENT:     Head: Normocephalic and atraumatic.  Eyes:     Extraocular Movements: Extraocular movements intact.     Conjunctiva/sclera: Conjunctivae normal.  Pulmonary:     Effort: Pulmonary effort is normal.  Genitourinary:    Comments: Deferred with shared decision making.  Self swab performed. Neurological:     General: No focal deficit present.     Mental Status: He is alert and oriented to person, place, and time. Mental status is at baseline.  Psychiatric:        Mood and Affect: Mood normal.        Behavior: Behavior normal.        Thought Content: Thought content normal.        Judgment: Judgment normal.      UC Treatments / Results  Labs (all labs ordered are listed, but only abnormal results are displayed) Labs Reviewed  POCT URINALYSIS DIP  (MANUAL ENTRY) - Abnormal; Notable for the following components:      Result Value   Leukocytes, UA Small (1+) (*)    All other components within normal limits  URINE CULTURE  CYTOLOGY, (ORAL, ANAL, URETHRAL) ANCILLARY ONLY    EKG   Radiology No results found.  Procedures Procedures (including critical care time)  Medications Ordered in UC Medications - No data to display  Initial Impression / Assessment and Plan / UC Course  I have reviewed the triage vital signs and the nursing notes.  Pertinent labs & imaging results that were available during my care of the patient were reviewed by me and considered in my medical decision making (see chart for details).     UA shows a small amount of leukocytes but with associated penile discharge and patient's age, I most concerned for STD.  Therefore, will send urine culture and cytology swab to confirm.  Awaiting results prior to treatment.  Advised strict follow-up precautions.  Patient verbalized understanding and was agreeable with plan. Final Clinical Impressions(s) / UC Diagnoses   Final diagnoses:  Dysuria  Penile discharge  Screening examination for venereal disease     Discharge Instructions      Penile swab pending.  We will call when it results.    ED Prescriptions   None    PDMP not reviewed this encounter.   Gustavus Bryant, [place] 01/18/24 1401

## 2024-01-18 NOTE — ED Triage Notes (Signed)
 Pt presents with STD exposure. Pt states it burns when he urinates and he has lower abdominal pain sxs x 1 week.

## 2024-01-19 ENCOUNTER — Ambulatory Visit
Admission: EM | Admit: 2024-01-19 | Discharge: 2024-01-19 | Disposition: A | Discharge status: Home / Self Care | Attending: Nurse Practitioner | Admitting: Nurse Practitioner

## 2024-01-19 ENCOUNTER — Telehealth (HOSPITAL_COMMUNITY): Payer: Self-pay

## 2024-01-19 DIAGNOSIS — A549 Gonococcal infection, unspecified: Secondary | ICD-10-CM | POA: Diagnosis not present

## 2024-01-19 DIAGNOSIS — A599 Trichomoniasis, unspecified: Secondary | ICD-10-CM

## 2024-01-19 LAB — CYTOLOGY, (ORAL, ANAL, URETHRAL) ANCILLARY ONLY
Chlamydia: NEGATIVE
Comment: NEGATIVE
Comment: NEGATIVE
Comment: NORMAL
Neisseria Gonorrhea: POSITIVE — AB
Trichomonas: POSITIVE — AB

## 2024-01-19 LAB — URINE CULTURE: Culture: NO GROWTH

## 2024-01-19 MED ORDER — CEFTRIAXONE SODIUM 500 MG IJ SOLR
500.0000 mg | INTRAMUSCULAR | Status: DC
  Administered 2024-01-19: 500 mg via INTRAMUSCULAR

## 2024-01-19 MED ORDER — METRONIDAZOLE 500 MG PO TABS: 2000.0000 mg | ORAL_TABLET | Freq: Once | ORAL | 0 refills | Status: AC

## 2024-01-19 NOTE — ED Triage Notes (Signed)
 Per Standing Order/Note:  Warren Danes, RN 01/19/2024  4:58 PM EDT Back to Top    Per protocol, pt will need to return to UC for Nurse Visit for Rocephin 500mg  IM. Per protocol, pt requires tx with metronidazole. Reviewed with patient, verified pharmacy, prescription sent. Contacted patient by phone.  Verified identity using two identifiers.  Provided positive result.  Reviewed safe sex practices, notifying partners, and refraining from sexual activities for 7 days from time of treatment.  Patient verified understanding, all questions answered.    No concerns. No questions from patient.

## 2024-01-19 NOTE — Telephone Encounter (Signed)
 Per protocol, pt will need to return to UC for Nurse Visit for Rocephin 500mg  IM.  Per protocol, pt requires tx with metronidazole. Reviewed with patient, verified pharmacy, prescription sent.

## 2024-08-21 ENCOUNTER — Ambulatory Visit (INDEPENDENT_AMBULATORY_CARE_PROVIDER_SITE_OTHER)

## 2024-08-21 ENCOUNTER — Ambulatory Visit: Admission: EM | Admit: 2024-08-21 | Discharge: 2024-08-21 | Disposition: A | Discharge status: Home / Self Care

## 2024-08-21 ENCOUNTER — Encounter: Payer: Self-pay | Admitting: Emergency Medicine

## 2024-08-21 DIAGNOSIS — J209 Acute bronchitis, unspecified: Secondary | ICD-10-CM

## 2024-08-21 MED ORDER — ALBUTEROL SULFATE HFA 108 (90 BASE) MCG/ACT IN AERS: 1.0000 | INHALATION_SPRAY | Freq: Four times a day (QID) | RESPIRATORY_TRACT | 0 refills | Status: AC | PRN

## 2024-08-21 MED ORDER — AZELASTINE HCL 0.1 % NA SOLN: 1.0000 | Freq: Two times a day (BID) | NASAL | 1 refills | Status: AC

## 2024-08-21 MED ORDER — PREDNISONE 20 MG PO TABS: 40.0000 mg | ORAL_TABLET | Freq: Every day | ORAL | 0 refills | Status: AC

## 2024-08-21 MED ORDER — AZITHROMYCIN 250 MG PO TABS: ORAL_TABLET | ORAL | 0 refills | Status: AC

## 2024-08-21 NOTE — ED Provider Notes (Signed)
 [facility]  Note:  This document was prepared using Conservation officer, historic buildings and may include unintentional dictation errors.  MRN: [id number] DOB: Mar 15, 1986  Subjective:   Dwayne Miller is a 38 y.o. male presenting for nasal congestion, productive cough, wheezing, chest congestion x 1 week.  Denies fever, chills, body aches, nausea/vomiting, constipation, abdominal pain, shortness of breath, chest pain.  Patient reports he had history of asthma as a child but has not had any exacerbations in several years.  Patient also states that he had some diarrhea over the last week.  Patient has been using over-the-counter DayQuil and NyQuil with minimal improvement to symptoms.  Patient also states that he used some Sudafed and has been taking some Tylenol .  Denies any known sick contacts.  No current facility-administered medications for this encounter.  Current Outpatient Medications:    azelastine (ASTELIN) 0.1 % nasal spray, Place 1 spray into both nostrils 2 (two) times daily. Use in each nostril as directed, Disp: 30 mL, Rfl: 1   predniSONE  (DELTASONE ) 20 MG tablet, Take 2 tablets (40 mg total) by mouth daily for 5 days., Disp: 10 tablet, Rfl: 0   albuterol  (VENTOLIN  HFA) 108 (90 Base) MCG/ACT inhaler, Inhale 1-2 puffs into the lungs every 6 (six) hours as needed for wheezing or shortness of breath., Disp: 8 g, Rfl: 0   azithromycin  (ZITHROMAX  Z-PAK) 250 MG tablet, Take 500 mg on day 1 followed by 250 mg for 4 days.  Take medication for a total of 5 days., Disp: 6 tablet, Rfl: 0   HYDROcodone -acetaminophen  (NORCO/VICODIN) 5-325 MG tablet, Take 1 tablet by mouth every 4 (four) hours as needed for moderate pain (pain score 4-6). (Patient not taking: Reported on 08/21/2024), Disp: 12 tablet, Rfl: 0   ibuprofen  (ADVIL ) 800 MG tablet, Take 1 tablet (800 mg total) by mouth every 8 (eight) hours as needed (pain). (Patient not taking: Reported on 08/21/2024), Disp: 21 tablet, Rfl: 0    pantoprazole (PROTONIX) 40 MG tablet, Take 40 mg by mouth 2 (two) times daily. (Patient not taking: Reported on 08/21/2024), Disp: , Rfl:    Allergies  Allergen Reactions   Bee Venom Anaphylaxis   Penicillins Anaphylaxis, Other (See Comments), Rash and Swelling    Childhood reaction  Has patient had a PCN reaction causing immediate rash, facial/tongue/throat swelling, SOB or lightheadedness with hypotension: yes  Has patient had a PCN reaction causing severe rash involving mucus membranes or skin necrosis: no  Has patient had a PCN reaction that required hospitalization no- childhood reaction  Has patient had a PCN reaction occurring within the last 10 years: no  If all of the above answers are NO, then may proceed with Cephalosporin use.  Other Reaction(s): Unknown    History reviewed. No pertinent past medical history.   History reviewed. No pertinent surgical history.  History reviewed. No pertinent family history.  Social History   Tobacco Use   Smoking status: Every Day    Current packs/day: 1.00    Types: Cigarettes   Smokeless tobacco: Never  Vaping Use   Vaping status: Never Used  Substance Use Topics   Alcohol use: Yes    Alcohol/week: 15.0 standard drinks of alcohol    Types: 15 Cans of beer per week   Drug use: No    ROS Refer to HPI for ROS details.  Objective:   Vitals: BP 109/74 (BP Location: Left Arm)   Pulse 78   Temp 97.9 F (36.6 C) (Oral)  Resp 14   SpO2 97%   Physical Exam Vitals and nursing note reviewed.  Constitutional:      General: He is not in acute distress.    Appearance: Normal appearance. He is well-developed. He is not ill-appearing or toxic-appearing.  HENT:     Head: Normocephalic.     Nose: Congestion present. No rhinorrhea.     Mouth/Throat:     Mouth: Mucous membranes are moist.  Cardiovascular:     Rate and Rhythm: Normal rate and regular rhythm.     Heart sounds: Normal heart sounds. No murmur  heard. Pulmonary:     Effort: Pulmonary effort is normal. No respiratory distress.     Breath sounds: No stridor. Wheezing and rhonchi present. No rales.  Chest:     Chest wall: No tenderness.  Skin:    General: Skin is warm and dry.  Neurological:     General: No focal deficit present.     Mental Status: He is alert and oriented to person, place, and time.  Psychiatric:        Mood and Affect: Mood normal.        Behavior: Behavior normal.     Procedures  No results found for this or any previous visit (from the past 24 hours).  DG Chest 2 View Result Date: 08/21/2024 CLINICAL DATA:  Chest congestion, cough and wheezing. EXAM: CHEST - 2 VIEW COMPARISON:  09/30/2017. FINDINGS: Trachea is midline. Heart size normal. Lungs are clear. No pleural fluid. IMPRESSION: Negative. Electronically Signed   By: Newell Eke M.D.   On: 08/21/2024 11:14     Assessment and Plan :     Discharge Instructions       1. Acute bronchitis, unspecified organism (Primary) - DG Chest 2 View x-ray performed in UC shows no acute cardiopulmonary processes, of consolidation or pneumonia. - albuterol  (VENTOLIN  HFA) 108 (90 Base) MCG/ACT inhaler; Inhale 1-2 puffs into the lungs every 6 (six) hours as needed for wheezing or shortness of breath.  Dispense: 8 g; Refill: 0 - predniSONE  (DELTASONE ) 20 MG tablet; Take 2 tablets (40 mg total) by mouth daily for 5 days.  Dispense: 10 tablet; Refill: 0 - azelastine (ASTELIN) 0.1 % nasal spray; Place 1 spray into both nostrils 2 (two) times daily. Use in each nostril as directed  Dispense: 30 mL; Refill: 1 - azithromycin  (ZITHROMAX  Z-PAK) 250 MG tablet; Take 500 mg on day 1 followed by 250 mg for 4 days.  Take medication for a total of 5 days.  Dispense: 6 tablet; Refill: 0 -Continue to monitor symptoms for any change in severity if there is any escalation of current symptoms or development of new symptoms follow-up in ER for further evaluation and  management.       Cloy Cozzens B Matison Nuccio   Gamal [place], [place], [place] 08/21/24 1126

## 2024-08-21 NOTE — ED Triage Notes (Signed)
 Pt reports nasal congestion, productive cough, and occasional wheezing x 1wk. Hx of childhood asthma. Denies fevers and chills. Denies N/V and constipation. Notes some episodes of diarrhea over the last week. Has taken dayquil and nyquil with some temporary relief.

## 2024-08-21 NOTE — Discharge Instructions (Addendum)
  1. Acute bronchitis, unspecified organism (Primary) - DG Chest 2 View x-ray performed in [facility] shows no acute cardiopulmonary processes, of consolidation or pneumonia. - albuterol  (VENTOLIN  HFA) 108 (90 Base) MCG/ACT inhaler; Inhale 1-2 puffs into the lungs every 6 (six) hours as needed for wheezing or shortness of breath.  Dispense: 8 g; Refill: 0 - predniSONE  (DELTASONE ) 20 MG tablet; Take 2 tablets (40 mg total) by mouth daily for 5 days.  Dispense: 10 tablet; Refill: 0 - azelastine (ASTELIN) 0.1 % nasal spray; Place 1 spray into both nostrils 2 (two) times daily. Use in each nostril as directed  Dispense: 30 mL; Refill: 1 - azithromycin  (ZITHROMAX  Z-PAK) 250 MG tablet; Take 500 mg on day 1 followed by 250 mg for 4 days.  Take medication for a total of 5 days.  Dispense: 6 tablet; Refill: 0 -Continue to monitor symptoms for any change in severity if there is any escalation of current symptoms or development of new symptoms follow-up in ER for further evaluation and management.
